# Patient Record
Sex: Female | Born: 1935 | Race: White | Hispanic: No | Smoking: Never smoker
Health system: Southern US, Community
[De-identification: ages and names within clinical notes are randomized; demographics above are authoritative.]

## PROBLEM LIST (undated history)

## (undated) DIAGNOSIS — I4891 Unspecified atrial fibrillation: Secondary | ICD-10-CM

## (undated) DIAGNOSIS — I82409 Acute embolism and thrombosis of unspecified deep veins of unspecified lower extremity: Secondary | ICD-10-CM

## (undated) HISTORY — PX: APPENDECTOMY: SHX54

## (undated) HISTORY — DX: Unspecified atrial fibrillation: I48.91

## (undated) HISTORY — DX: Acute embolism and thrombosis of unspecified deep veins of unspecified lower extremity: I82.409

## (undated) HISTORY — PX: CHOLECYSTECTOMY: SHX55

---

## 2001-02-28 ENCOUNTER — Encounter: Payer: Self-pay | Admitting: Emergency Medicine

## 2001-02-28 ENCOUNTER — Emergency Department (HOSPITAL_COMMUNITY): Admission: EM | Admit: 2001-02-28 | Discharge: 2001-02-28 | Payer: Self-pay | Admitting: Emergency Medicine

## 2001-03-01 ENCOUNTER — Ambulatory Visit (HOSPITAL_COMMUNITY): Admission: RE | Admit: 2001-03-01 | Discharge: 2001-03-01 | Payer: Self-pay | Admitting: Obstetrics & Gynecology

## 2002-07-13 ENCOUNTER — Ambulatory Visit (HOSPITAL_COMMUNITY): Admission: RE | Admit: 2002-07-13 | Discharge: 2002-07-13 | Payer: Self-pay | Admitting: Gastroenterology

## 2002-07-13 ENCOUNTER — Encounter (INDEPENDENT_AMBULATORY_CARE_PROVIDER_SITE_OTHER): Payer: Self-pay | Admitting: *Deleted

## 2003-03-12 ENCOUNTER — Ambulatory Visit (HOSPITAL_COMMUNITY): Admission: RE | Admit: 2003-03-12 | Discharge: 2003-03-12 | Payer: Self-pay | Admitting: Obstetrics & Gynecology

## 2003-09-26 ENCOUNTER — Inpatient Hospital Stay (HOSPITAL_COMMUNITY): Admission: EM | Admit: 2003-09-26 | Discharge: 2003-09-28 | Payer: Self-pay | Admitting: Emergency Medicine

## 2003-10-03 ENCOUNTER — Encounter: Admission: RE | Admit: 2003-10-03 | Discharge: 2003-10-03 | Payer: Self-pay | Admitting: Internal Medicine

## 2003-10-24 ENCOUNTER — Encounter: Admission: RE | Admit: 2003-10-24 | Discharge: 2003-10-24 | Payer: Self-pay | Admitting: Internal Medicine

## 2004-02-07 ENCOUNTER — Emergency Department (HOSPITAL_COMMUNITY): Admission: EM | Admit: 2004-02-07 | Discharge: 2004-02-08 | Payer: Self-pay | Admitting: Emergency Medicine

## 2004-03-19 ENCOUNTER — Ambulatory Visit (HOSPITAL_COMMUNITY): Admission: RE | Admit: 2004-03-19 | Discharge: 2004-03-19 | Payer: Self-pay | Admitting: Internal Medicine

## 2005-05-25 ENCOUNTER — Ambulatory Visit (HOSPITAL_COMMUNITY): Admission: RE | Admit: 2005-05-25 | Discharge: 2005-05-26 | Payer: Self-pay | Admitting: Specialist

## 2005-08-27 ENCOUNTER — Encounter: Admission: RE | Admit: 2005-08-27 | Discharge: 2005-08-27 | Payer: Self-pay | Admitting: Specialist

## 2010-04-11 ENCOUNTER — Ambulatory Visit: Payer: Self-pay | Admitting: Internal Medicine

## 2010-04-16 LAB — CBC WITH DIFFERENTIAL/PLATELET
BASO%: 0.3 % (ref 0.0–2.0)
Basophils Absolute: 0 10*3/uL (ref 0.0–0.1)
EOS%: 2.4 % (ref 0.0–7.0)
Eosinophils Absolute: 0.1 10*3/uL (ref 0.0–0.5)
HCT: 43.5 % (ref 34.8–46.6)
HGB: 14.9 g/dL (ref 11.6–15.9)
LYMPH%: 33.6 % (ref 14.0–49.7)
MCH: 32.5 pg (ref 25.1–34.0)
MCHC: 34.3 g/dL (ref 31.5–36.0)
MCV: 94.7 fL (ref 79.5–101.0)
MONO#: 0.6 10*3/uL (ref 0.1–0.9)
MONO%: 10.3 % (ref 0.0–14.0)
NEUT#: 3.1 10*3/uL (ref 1.5–6.5)
NEUT%: 53.4 % (ref 38.4–76.8)
Platelets: 220 10*3/uL (ref 145–400)
RBC: 4.6 10*6/uL (ref 3.70–5.45)
RDW: 12.5 % (ref 11.2–14.5)
WBC: 5.8 10*3/uL (ref 3.9–10.3)
lymph#: 1.9 10*3/uL (ref 0.9–3.3)

## 2010-04-16 LAB — COMPREHENSIVE METABOLIC PANEL
ALT: 30 U/L (ref 0–35)
AST: 28 U/L (ref 0–37)
Albumin: 3.9 g/dL (ref 3.5–5.2)
Alkaline Phosphatase: 101 U/L (ref 39–117)
BUN: 16 mg/dL (ref 6–23)
CO2: 22 mEq/L (ref 19–32)
Calcium: 8.8 mg/dL (ref 8.4–10.5)
Chloride: 107 mEq/L (ref 96–112)
Creatinine, Ser: 0.85 mg/dL (ref 0.40–1.20)
Glucose, Bld: 90 mg/dL (ref 70–99)
Potassium: 4.5 mEq/L (ref 3.5–5.3)
Sodium: 143 mEq/L (ref 135–145)
Total Bilirubin: 0.4 mg/dL (ref 0.3–1.2)
Total Protein: 7.2 g/dL (ref 6.0–8.3)

## 2010-04-16 LAB — LACTATE DEHYDROGENASE: LDH: 194 U/L (ref 94–250)

## 2010-04-16 LAB — IRON AND TIBC
%SAT: 39 % (ref 20–55)
Iron: 102 ug/dL (ref 42–145)
TIBC: 260 ug/dL (ref 250–470)
UIBC: 158 ug/dL

## 2010-04-16 LAB — FERRITIN: Ferritin: 206 ng/mL (ref 10–291)

## 2010-04-16 LAB — VITAMIN B12: Vitamin B-12: 918 pg/mL — ABNORMAL HIGH (ref 211–911)

## 2010-04-16 LAB — FOLATE: Folate: >20 ng/mL

## 2011-02-13 NOTE — Op Note (Signed)
NAMESAMANI, DEAL NO.:  [id number]   MEDICAL RECORD NO.:  [id number]          PATIENT TYPE:  AMB   LOCATION:  DAY                          FACILITY:  [facility]   PHYSICIAN:  Jene Every, M.D.    DATE OF BIRTH:  [birth year]   DATE OF PROCEDURE:  05/25/2005  DATE OF DISCHARGE:                                 OPERATIVE REPORT   PREOPERATIVE DIAGNOSIS:  Rotator cuff and impingement syndrome of the right  shoulder.   PROCEDURE PERFORMED:  1.  Examination under anesthesia.  2.  Right shoulder open acromioplasty with subacromial decompression, repair      of rotator cuff, utilizing two Mitek suture anchors.   ANESTHESIA:  General.   ASSISTANT:  Roma Schanz, P.A.   BRIEF HISTORY/INDICATIONS:  A 77 year old female with refractory shoulder  pain.  MRI indicating rotator cuff tear.  Operative intervention was  indicated for repair of the torn rotator cuff.  Risks and benefits were  discussed, including bleeding, infection, damage to vascular structures,  suboptimal range of motion, retear of knee, need for revision, etc.   TECHNIQUE:  With the patient supine in a beach-chair position after the  induction of general anesthesia and antibiotics, the right shoulder and  upper extremity was prepped and draped in the usual sterile fashion.  Just  prior to that, we ranged the shoulder.  She had full range of motion of the  shoulder.  An incision was made in the anterior aspect of the shoulder and  __________ line.  Subcutaneous tissue was dissected .  Electrocautery  utilized to achieved hemostasis.  Raphe between the anterior and lateral  heads was identified and developed.  Subperiosteal elevator from the  anterolateral aspect of the acromion and anteromedial aspect of the  acromion.  The ACL ligament was detached and excised.  The anterior spur was  removed with an oscillating saw and a Behr rongeur to a type 1 acromion.  Hypertrophic bursa were then excised.  A  full thickness retracted tear, 1 x  1 cm, was noted in the supraspinatus.  We diverted the edges.  Performed a  bony trough with a Beyer rongeur.  Advanced the tendon and digitally  mobilized that.  Broke up some subacromial adhesions.  I then advanced and  delivered it into the trough and anchored it with two Mitek suture anchors  with excellent purchase.  Good range without tension on the repair site.  Full coverage, water-tight seal was noted.  The wound was copiously  irrigated with antibiotic irrigation.  I repaired the raphe with #1 Vicryl  interrupted figure-of-eight sutures over to and through the acromion.  Subcutaneous tissue reapproximated with 2-0 Vicryl sutures.  The skin is  reapproximated with 4-0 subcuticular  Prolene.  We reinforced Steri-Strips.  Sterile dressing applied.  Placed in  an abduction pillow.  Extubated without difficulty and transported to the  recovery room in satisfactory condition.  Patient tolerated the procedure  well with no complications.  Minimal blood loss.      Jene Every, M.D.  Electronically Signed     JB/MEDQ  D:  05/25/2005  T:  05/25/2005  Job:  [id number]

## 2011-02-13 NOTE — Discharge Summary (Signed)
NAME:  EUDELL, JULIAN                          ACCOUNT NO.:  [id number]   MEDICAL RECORD NO.:  [id number]                   PATIENT TYPE:  INP   LOCATION:  3728                                 FACILITY:  [facility]   PHYSICIAN:  C. Ulyess Mort, M.D.             DATE OF BIRTH:  1933/03/15   DATE OF ADMISSION:  09/26/2003  DATE OF DISCHARGE:  09/28/2003                                 DISCHARGE SUMMARY   DISCHARGE DIAGNOSES:  1. Chest pain, atypical.  Cardiolite on August 30, 2003, with normal     ejection fraction and normal perfusion.  Cardiac enzymes negative x 3.  D-     dimer negative.  Carotid Dopplers negative.  2. Hyperlipidemia.  3. Hypokalemia.  4. Glaucoma.  5. Status post cholecystectomy.  6. Status post polypectomy in October of 2003.   DISCHARGE MEDICATIONS:  1. Aspirin 81 mg p.o. daily.  2. Travatan eyedrops one drop q.h.s.  3. Protonix 40 mg p.o. daily.  4. Toprol XL 12.5 mg p.o. daily.   HOSPITAL FOLLOWUP:  The patient will follow up with Dr. Jens Som of [facility]  [facility]iology in two to four weeks.  The patient will follow up in the [facility] with Dr. Aundria Rud.   CONSULTS:  Olga Millers, M.D., with [facility] on September 26, 2003.   PROCEDURES:  1. On September 26, 2003, chest x-ray.  Impression:  No acute abnormality.  2. On September 27, 2003, Cardiolite.  Impression:  No evidence of     pharmacologic-induced myocardial ischemia, normal left ventricular wall     motion study, calculated ejection fraction of 66%.  3. On September 27, 2003, carotid Dopplers.  Impression:  Bilaterally no ICA     stenosis, vertebral artery with flow antegrade.   HISTORY OF PRESENT ILLNESS:  A 77 year old white female who presented to the  emergency department with chest pain.  At approximately 12:30 a.m. the  morning of admission, the patient awoke with substernal chest pain.  It  began in the epigastric region and radiated to the neck, shoulders,  back,  and both arms, left arm greater than right.  She described the pain as  cramping in sensation.  It was associated with nausea and shortness of  breath.  However, there was no vomiting or diaphoresis.  The pain lasted for  approximately 12 hours.  She rated the pain as a 10/10 with a decline to  6/10 in the emergency room on nitroglycerin and aspirin.  The patient also  reports feeling the discomfort to both arms over the last week.   ALLERGIES:  PENICILLIN.   SOCIAL HISTORY:  Occasional smoking.  No alcohol, cocaine, or IV fluids.  Widowed in 1990.  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  The patient has one son who  was called in an accident in 2000.  The patient recently lost the couple for  whom she worked within the last month.   FAMILY MEDICAL HISTORY:  Mother deceased at 7 with a history of strokes.  Father deceased at 75 secondary to accident.  The patient has three sisters,  one with DDD, and two brothers, one with arthritis.  The patient has one  daughter who is healthy.   REVIEW OF SYSTEMS:  Fatigue, chest pain, palpitations, dyspnea, nausea,  weakness in hand grip, depression, anxiety, and imbalance.   PHYSICAL EXAMINATION:  VITAL SIGNS:  Temperature 98.2 degrees, blood  pressure 156/69, pulse 60, respirations 20, saturating 94% on room air.  GENERAL APPEARANCE:  Resting comfortably in no apparent distress.  HEENT:  Extraocular movements intact.  Pupils equal, round, and reactive to  light.  Oropharynx clear.  NECK:  No lymphadenopathy.  No JVD.  LUNGS:  Clear to auscultation bilaterally.  HEART:  Regular rate and rhythm.  No murmurs, rubs, or gallops.  ABDOMEN:  Soft and nondistended.  Positive bowel sounds.  Mildly tender to  palpation with guarding of right lower quadrant.  EXTREMITIES:  No cyanosis, clubbing, or edema.  Good pulses.  SKIN:  Without rash.  NEUROLOGIC:  No focal deficits.  Good strength  throughout.  PSYCHIATRIC:  Depressed.   LABORATORY DATA:  EKG with normal sinus rhythm with no ST changes,  depression, or elevation.  Chest x-ray with no active disease.  Sodium 142,  potassium 4.2, chloride 107, bicarbonate 27, BUN 15, creatinine 0.7, glucose  100.  WBC 4.8, hemoglobin 15.6, platelets 227.   HOSPITAL COURSE:  #1 - CHEST PAIN:  The differential diagnosis includes  acute MI, acute coronary artery syndrome, pulmonary embolism, aortic  dissection, GERD, and panic disorder.  The patient was placed heparin and a  nitroglycerin drip.  Cardiac enzymes were negative x 3.  EKG showed no ST  elevation or depression or T wave changes.  The patient's blood pressure was  checked in both arms and the patient was ruled out for aortic dissection by  this.  Chest x-ray showed no mediastinal widening.  D-dimer within normal  limits.  The patient was sent for Cardiolite which showed no ischemia and a  normal ejection fraction.  The patient was placed on aspirin.  With negative  cardiac enzymes and Cardiolite, heparin and nitroglycerin were discontinued.  The patient will be continued on baby aspirin and Toprol 12.5 mg daily as an  outpatient.  The patient will have followup with Olga Millers, M.D., in  two to four weeks.  For frank chest pain, the patient may need cardiac  catheterization.  This will be reviewed with Dr. Jens Som in his office.   #2 - VENTRICULAR TACHYCARDIA:  The patient had two runs of nonsustained  ventricular tachycardia.  She was asymptomatic during these episodes.  No  syncope.  No shortness of breath.  The patient was placed on Toprol 12.5 mg  daily.  The patient's potassium was repleted as needed.  Dr. Jens Som will  consider outpatient Holter monitor on Toprol.  This will be discussed at her  followup outpatient visit.   #3 - HYPOKALEMIA:  This was repleted and the discharge potassium is 4.2.  #4 - HYPERLIPIDEMIA:  The patient's total cholesterol and LDL  cholesterol  are elevated.  The patient has no history of cardiac disease, no family  history of cardiac disease, no history of smoking, and no history of  diabetes.  The patient may require treatment with statins in the future.  However, the first option would be control with diet.  It can be further  adjusted as an outpatient.   #5 - DEPRESSION/ANXIETY:  The patient has had multiple stressors in the last  several years.  The patient could possibly benefit from grief counseling or  the addition of an antidepressant.  This too can be addressed at her  followup outpatient visit.   DISCHARGE LABORATORIES:  WBC 4.6, hemoglobin 14.4, hematocrit 43, platelets  219.  Sodium 141, potassium 4.2, chloride 109, bicarbonate 28, BUN 14,  creatinine 0.9, glucose 65, calcium 8.9, magnesium 2.3.  TSH 2.270.  Total  cholesterol 223, triglycerides 94, HDL 66, LDL 138.  Hemoglobin A1C 5.4.  D-  dimer 0.39.  CKs 187, 168, and 173, CK-MBs 1.5, 1.0, and 1.2, and troponins  0.01, less than 0.01, and 0.01.      Delbert Harness, MD                    Gary Fleet, M.D.    Cleotis Lema  D:  09/28/2003  T:  09/28/2003  Job:  [id number]

## 2011-02-13 NOTE — Consult Note (Signed)
NAME:  Angela Leach, Angela Leach                          ACCOUNT NO.:  [id number]   MEDICAL RECORD NO.:  [id number]                   PATIENT TYPE:  INP   LOCATION:  3728                                 FACILITY:  [facility]   PHYSICIAN:  Olga Millers, M.D.                DATE OF BIRTH:  [birth year]   DATE OF CONSULTATION:  09/26/2003  DATE OF DISCHARGE:                                   CONSULTATION   REASON FOR CONSULTATION:  Angela Leach is a pleasant 77 year old female with  no prior cardiac history who we are asked to evaluate for chest pain.   HISTORY OF PRESENT ILLNESS:  The patient typically did not have exertional  chest pain, dyspnea on exertion, orthopnea, or history of syncope.  She does  have chronic mild pedal edema.  At approximately 12:30 this morning, she  awoke with substernal chest pain. It began in the epigastric area and  radiated to her neck, shoulders, and back.  She described the pain as a  severe hurt.  There was mild nausea and shortness of breath but there was  no diaphoresis.  The pain was not pleuritic in position, nausea, or related  to food.  It lasted for approximately 12 hours and improved, although did  not completely resolve with nitroglycerin and aspirin in the emergency room.   Because of these symptoms, we were asked to further evaluate.   PAST MEDICAL HISTORY:  1. There is no diabetes mellitus, hypertension, or hyperlipidemia.  2. She has had a prior cholecystectomy.  3. She has had a prior fractured ankle.  4. She has had a previous polypectomy.  5. Glaucoma.   SOCIAL HISTORY:  She does not smoke nor does she consume alcohol.   FAMILY HISTORY:  Negative for coronary artery disease.   REVIEW OF SYMPTOMS:  She is under a significant amount of stress recently.  She has family issues as well as financial issues by her and her daughter's  report.  She denies any headaches.  No fevers or chills.  There is no  productive cough or hemoptysis.  There is no  odynophagia, dysphagia, melena,  or hematochezia.  There is no dysuria or hematuria.  There is no rash or  seizure activity. There is no orthopnea or PND but there is mild chronic  pedal edema.  There is no claudication noted. She does have some pain in her  feet when she ambulates related to bone spur.  The remaining systems are  negative.   PHYSICAL EXAMINATION:  VITAL SIGNS:  She has a blood pressure 97/67, pulse  is 81.  She is afebrile.  GENERAL:  She is well-developed, well-nourished and in no acute distress.  She is somewhat depressed at the time of the evaluation.  SKIN:  Warm and dry.  EXTREMITIES:  There is no peripheral clubbing.  HEENT:  Unremarkable with normal eye lids.  NECK:  Supple with  normal upstrokes bilaterally and no bruits noted.  There  is no jugular venous distention and no thyromegaly noted.  CHEST:  Clear to auscultation.  Normal expansion.  CARDIOVASCULAR:  Regular rate and rhythm.  Normal S1 and S2.  There are no  murmurs, rubs, or gallops noted.  ABDOMEN:  Nontender.  No hepatosplenomegaly and no masses appreciated.  She  has a prior scar from a prior cholecystectomy.  EXTREMITIES:  She has 2+ femoral pulses bilaterally and no bruits.  Her  extremities show no edema, although there are mild varicosities.  I cannot  palpate cords.  She has 2+ dorsalis pedis pulses bilaterally.  NEUROLOGICAL:  Grossly intact.   LABORATORY DATA:  Electrocardiogram shows normal sinus rhythm with  occasional PVCs and no ST changes.  Initial enzymes are negative.  Her d-  dimer is normal at 0.39.  Her chest x-ray shows no acute disease.   DIAGNOSIS:  Atypical chest pain.   PLAN:  Angela Leach presents with chest pain of uncertain etiology. It is  somewhat atypical as it is prolonged in duration at approximately 12 hours  with a negative electrocardiogram and her initial enzymes are also negative.  We will continue to cycle enzymes to rule out myocardial infarction.  If   there are negative, we will plan to risk stratify with an Adenosine-  Cardiolite tomorrow morning.  If her d-dimer is negative, therefore, I think  we have excluded pulmonary embolus.  She is under a significant amount of  stress and there is also some tenderness on exam.  I wonder if this may be  musculoskeletal.  She also has a significant amount of belching and I agree  with Protonix.  She has recently complained of some numbness in her left  upper extremity and we will check carotid dopplers to exclude carotid  disease.                                               Olga Millers, M.D.    BC/MEDQ  D:  09/26/2003  T:  09/26/2003  Job:  [id number]

## 2011-04-27 ENCOUNTER — Other Ambulatory Visit: Payer: Self-pay | Admitting: Gastroenterology

## 2014-04-14 ENCOUNTER — Emergency Department (HOSPITAL_COMMUNITY)
Admission: EM | Admit: 2014-04-14 | Discharge: 2014-04-14 | Disposition: A | Discharge status: Home / Self Care | Payer: Medicare PPO | Attending: Emergency Medicine | Admitting: Emergency Medicine

## 2014-04-14 ENCOUNTER — Emergency Department (HOSPITAL_COMMUNITY): Payer: Medicare PPO

## 2014-04-14 ENCOUNTER — Encounter (HOSPITAL_COMMUNITY): Payer: Self-pay | Admitting: Emergency Medicine

## 2014-04-14 DIAGNOSIS — Y9389 Activity, other specified: Secondary | ICD-10-CM | POA: Insufficient documentation

## 2014-04-14 DIAGNOSIS — W298XXA Contact with other powered powered hand tools and household machinery, initial encounter: Secondary | ICD-10-CM | POA: Insufficient documentation

## 2014-04-14 DIAGNOSIS — S61219A Laceration without foreign body of unspecified finger without damage to nail, initial encounter: Secondary | ICD-10-CM

## 2014-04-14 DIAGNOSIS — Y9289 Other specified places as the place of occurrence of the external cause: Secondary | ICD-10-CM | POA: Insufficient documentation

## 2014-04-14 DIAGNOSIS — S61209A Unspecified open wound of unspecified finger without damage to nail, initial encounter: Secondary | ICD-10-CM | POA: Insufficient documentation

## 2014-04-14 NOTE — ED Provider Notes (Signed)
CSN: [id number]     Arrival date & time 04/14/14  1305 History  This chart was scribed for non-physician practitioner, Rhea Bleacher, PA-C working with Shanna Cisco, MD, by Jarvis Morgan, ED Scribe. This patient was seen in room TR07C/TR07C and the patient's care was started at 1:08 PM.      Chief Complaint  Patient presents with  . Extremity Laceration    The history is provided by the patient. No language interpreter was used.   HPI Comments: Angela Leach is a 80 y.o. female who presents to the Emergency Department due to laceration to her ring and middle finger on her left hand that occurred 1 day ago. Patient states she was trying to take a power saw blade off while doing yard work. The laceration was actually caused by the [facility] wrench she was using to take the saw blade off. She states she contained the bleeding yesterday with Band-Aid's. Patient is not currently on any blood thinner medication. There is no active bleeding at this time. She states that she is unable to move those two fingers. Patient states the saw blade was clean and she washed the cut area immediately after the laceration occurred. She states she also put hydrogen peroxide on the area. She states that her vacinnations are UTD. She denies any numbness or rash to the area.    History reviewed. No pertinent past medical history. History reviewed. No pertinent past surgical history. History reviewed. No pertinent family history. History  Substance Use Topics  . Smoking status: Never Smoker   . Smokeless tobacco: Not on file  . Alcohol Use: Not on file   OB History   Grav Para Term Preterm Abortions TAB SAB Ect Mult Living                 Review of Systems  Constitutional: Negative for activity change.  Musculoskeletal: Positive for arthralgias (ring and middle finger on left hand). Negative for back pain, joint swelling and neck pain.  Skin: Positive for wound (lac to the left ring and middle finger. no active  bleeding). Negative for rash.  Neurological: Negative for weakness and numbness.    Allergies  Review of patient's allergies indicates not on file.  Home Medications   Prior to Admission medications   Not on File   Triage Vitals: BP 126/72  Pulse 74  Temp(Src) 98.3 F (36.8 C) (Oral)  Resp 20  SpO2 97%  Physical Exam  Nursing note and vitals reviewed. Constitutional: She appears well-developed and well-nourished. No distress.  HENT:  Head: Normocephalic and atraumatic.  Eyes: Conjunctivae and EOM are normal. Pupils are equal, round, and reactive to light.  Neck: Normal range of motion. Neck supple. No tracheal deviation present.  Cardiovascular: Normal rate.  Exam reveals no decreased pulses.   Pulmonary/Chest: Effort normal. No respiratory distress.  Musculoskeletal: Normal range of motion. She exhibits tenderness. She exhibits no edema.  Neurological: She is alert. No sensory deficit.  Motor, sensation, and vascular distal to the injury is fully intact.   Skin: Skin is warm and dry. Laceration noted.  Normal cap refill. 1cm laceration to the dorsum of 3rd and 4th digits between MCP and PIP. Normal extension and flexion with some pain. Skin is healed and wound is approximated. No drainage. Normal cap refill.   Psychiatric: She has a normal mood and affect. Her behavior is normal.    ED Course  Procedures (including critical care time)  DIAGNOSTIC STUDIES: Oxygen Saturation is  97% on RA, normal by my interpretation.    COORDINATION OF CARE: 1:15 PM- Will order diagnostic imaging of left hand. Pt advised of plan for treatment and pt agrees.   Labs Review Labs Reviewed - No data to display  Imaging Review Dg Hand Complete Left  04/14/2014   CLINICAL DATA:  Lacerations between the third and fourth digits.  EXAM: LEFT HAND - COMPLETE 3+ VIEW  COMPARISON:  None.  FINDINGS: Distal tuft of the fourth finger has a lobulated appearance without a convincing fracture line.  This may be from remote trauma.  No acute fracture. No dislocation. Bones are diffusely demineralized.  No radiopaque foreign body.  IMPRESSION: No acute fracture or dislocation.  No radiopaque foreign body.   Electronically Signed   By: Amie Portlandavid  Ormond M.D.   On: 04/14/2014 13:56     EKG Interpretation None      BP 126/72  Pulse 74  Temp(Src) 98.3 F (36.8 C) (Oral)  Resp 20  SpO2 97%  Pt informed of x-ray results. Patient counseled on wound care. Patient was urged to return to the Emergency Department urgently with worsening pain, swelling, expanding erythema especially if it streaks away from the affected area, fever, or if they have any other concerns. Patient verbalized understanding.     MDM   Final diagnoses:  Finger laceration, initial encounter   Patient with finger laceration. Normal extension and sensation. Tetanus up-to-date. Fingers are neurovascularly intact. X-ray neg. No immunocompromise. Wound is not dirty. Do not feel abx prophylaxis indicated.   I personally performed the services described in this documentation, which was scribed in my presence. The recorded information has been reviewed and is accurate.     Renne CriglerJoshua Adalaide Jaskolski, PA-C 04/14/14 1413

## 2014-04-14 NOTE — ED Notes (Signed)
Pt was using a power saw to do tile work in her bathroom. She lacerated 3rd and 4th fingers. This happened 24 hours ago. Bleeding is controlled edges approximate well.

## 2014-04-14 NOTE — ED Notes (Signed)
Patient transported to X-ray 

## 2014-04-14 NOTE — ED Notes (Signed)
Lacerations to left ring and middle fingers cleaned with a combination of normal saline and hydrogen peroxide; applied bacitracin to both lacerations, applied sterile 2x2s and wrapped with guaze on both fingers

## 2014-04-14 NOTE — ED Provider Notes (Signed)
Medical screening examination/treatment/procedure(s) were performed by non-physician practitioner and as supervising physician I was immediately available for consultation/collaboration.  Giulianna Rocha E Onesha Krebbs, MD 04/14/14 2042 

## 2014-04-14 NOTE — Discharge Instructions (Signed)
Please read and follow all provided instructions.  Your diagnoses today include:  1. Finger laceration, initial encounter    Tests performed today include:  X-ray of the affected area that did not show any foreign bodies or broken bones  Vital signs. See below for your results today.   Medications prescribed:   None  Take any prescribed medications only as directed.   Home care instructions:  Follow any educational materials and wound care instructions contained in this packet.   Keep affected area above the level of your heart when possible to minimize swelling. Wash area gently twice a day with warm soapy water. Do not apply alcohol or hydrogen peroxide. Cover the area if it draining or weeping.   Follow-up instructions:  Return instructions:  Return to the Emergency Department if you have:  Fever  Worsening pain  Worsening swelling of the wound  Pus draining from the wound  Redness of the skin that moves away from the wound, especially if it streaks away from the affected area   Any other emergent concerns  Your vital signs today were: BP 126/72   Pulse 74   Temp(Src) 98.3 F (36.8 C) (Oral)   Resp 20   SpO2 97% If your blood pressure (BP) was elevated above 135/85 this visit, please have this repeated by your doctor within one month. --------------

## 2015-02-04 ENCOUNTER — Encounter (HOSPITAL_COMMUNITY): Payer: Self-pay | Admitting: Emergency Medicine

## 2015-02-04 ENCOUNTER — Emergency Department (HOSPITAL_COMMUNITY): Payer: PPO

## 2015-02-04 ENCOUNTER — Observation Stay (HOSPITAL_COMMUNITY)
Admission: EM | Admit: 2015-02-04 | Discharge: 2015-02-05 | Disposition: A | Discharge status: Home / Self Care | Payer: PPO | Attending: Internal Medicine | Admitting: Internal Medicine

## 2015-02-04 DIAGNOSIS — S42201A Unspecified fracture of upper end of right humerus, initial encounter for closed fracture: Principal | ICD-10-CM | POA: Insufficient documentation

## 2015-02-04 DIAGNOSIS — Y92009 Unspecified place in unspecified non-institutional (private) residence as the place of occurrence of the external cause: Secondary | ICD-10-CM | POA: Insufficient documentation

## 2015-02-04 DIAGNOSIS — Y9301 Activity, walking, marching and hiking: Secondary | ICD-10-CM | POA: Insufficient documentation

## 2015-02-04 DIAGNOSIS — W19XXXA Unspecified fall, initial encounter: Secondary | ICD-10-CM | POA: Diagnosis not present

## 2015-02-04 DIAGNOSIS — S42301A Unspecified fracture of shaft of humerus, right arm, initial encounter for closed fracture: Secondary | ICD-10-CM | POA: Diagnosis present

## 2015-02-04 DIAGNOSIS — Z880 Allergy status to penicillin: Secondary | ICD-10-CM | POA: Diagnosis not present

## 2015-02-04 DIAGNOSIS — R42 Dizziness and giddiness: Secondary | ICD-10-CM | POA: Diagnosis not present

## 2015-02-04 LAB — I-STAT CHEM 8, ED
BUN: 24 mg/dL — ABNORMAL HIGH (ref 6–20)
Calcium, Ion: 1.12 mmol/L — ABNORMAL LOW (ref 1.13–1.30)
Chloride: 106 mmol/L (ref 101–111)
Creatinine, Ser: 0.9 mg/dL (ref 0.44–1.00)
Glucose, Bld: 119 mg/dL — ABNORMAL HIGH (ref 70–99)
HCT: 45 % (ref 36.0–46.0)
Hemoglobin: 15.3 g/dL — ABNORMAL HIGH (ref 12.0–15.0)
Potassium: 3.8 mmol/L (ref 3.5–5.1)
Sodium: 142 mmol/L (ref 135–145)
TCO2: 22 mmol/L (ref 0–100)

## 2015-02-04 LAB — I-STAT TROPONIN, ED: TROPONIN I, POC: 0 ng/mL (ref 0.00–0.08)

## 2015-02-04 MED ORDER — HEPARIN SODIUM (PORCINE) 5000 UNIT/ML IJ SOLN
5000.0000 [IU] | Freq: Three times a day (TID) | INTRAMUSCULAR | Status: DC
  Administered 2015-02-05 (×3): 5000 [IU] via SUBCUTANEOUS
  Filled 2015-02-04 (×3): qty 1

## 2015-02-04 MED ORDER — SENNOSIDES-DOCUSATE SODIUM 8.6-50 MG PO TABS: 1.0000 | ORAL_TABLET | Freq: Every evening | ORAL | Status: DC | PRN

## 2015-02-04 MED ORDER — HYDROCODONE-ACETAMINOPHEN 5-325 MG PO TABS
1.0000 | ORAL_TABLET | ORAL | Status: DC | PRN
  Administered 2015-02-05 (×4): 2 via ORAL
  Filled 2015-02-04 (×4): qty 2

## 2015-02-04 MED ORDER — ONDANSETRON HCL 4 MG PO TABS: 4.0000 mg | ORAL_TABLET | Freq: Four times a day (QID) | ORAL | Status: DC | PRN

## 2015-02-04 MED ORDER — ACETAMINOPHEN 650 MG RE SUPP: 650.0000 mg | Freq: Four times a day (QID) | RECTAL | Status: DC | PRN

## 2015-02-04 MED ORDER — ACETAMINOPHEN 325 MG PO TABS: 650.0000 mg | ORAL_TABLET | Freq: Four times a day (QID) | ORAL | Status: DC | PRN

## 2015-02-04 MED ORDER — HYDROCODONE-ACETAMINOPHEN 5-325 MG PO TABS
2.0000 | ORAL_TABLET | Freq: Once | ORAL | Status: AC
  Administered 2015-02-04: 2 via ORAL
  Filled 2015-02-04: qty 2

## 2015-02-04 MED ORDER — ONDANSETRON HCL 4 MG/2ML IJ SOLN: 4.0000 mg | Freq: Four times a day (QID) | INTRAMUSCULAR | Status: DC | PRN

## 2015-02-04 MED ORDER — OXYCODONE-ACETAMINOPHEN 5-325 MG PO TABS
2.0000 | ORAL_TABLET | Freq: Once | ORAL | Status: AC
  Administered 2015-02-04: 2 via ORAL
  Filled 2015-02-04: qty 2

## 2015-02-04 MED ORDER — SODIUM CHLORIDE 0.9 % IJ SOLN
3.0000 mL | Freq: Two times a day (BID) | INTRAMUSCULAR | Status: DC
  Administered 2015-02-05 (×2): 3 mL via INTRAVENOUS

## 2015-02-04 MED ORDER — SODIUM CHLORIDE 0.9 % IV SOLN: INTRAVENOUS | Status: DC

## 2015-02-04 NOTE — ED Notes (Signed)
Phlebotomy enroute to draw labs. 

## 2015-02-04 NOTE — ED Notes (Signed)
Pt ambulated well to door in room. Pt felt dizzy walking back to bed.

## 2015-02-04 NOTE — ED Notes (Signed)
Attempted report 

## 2015-02-04 NOTE — ED Notes (Signed)
EMT and NT at bedside for ambulating patient.

## 2015-02-04 NOTE — Progress Notes (Signed)
Orthopedic Tech Progress Note Patient Details:  Rodney Boozerlis M Wigger Feb 20, 1933 [id number]  Ortho Devices Type of Ortho Device: Arm sling Ortho Device/Splint Location: RUE Ortho Device/Splint Interventions: Ordered, Application   Jennye MoccasinHughes, Ilo Beamon Craig 02/04/2015, 9:03 PM

## 2015-02-04 NOTE — H&P (Signed)
[facility] History and Physical  Patient: Angela Leach  MRN: [id number]  DOB: 07/15/1933  DOS: the patient was seen and examined on 02/04/2015 PCP: No primary care provider on file.  Referring physician: Glynn Octave, MD Chief Complaint: Dizziness, mechanical fall  HPI: Angela Leach is a 81 y.o. female with no significant Past medical history. The patient is presenting to the hospital after a mechanical fall. Patient mentions that she was walking in her kitchen and her left foot tripped over and moving and she fell kitchen floor hitting the table on the right forehead. She passed out and found herself underneath the table. Her daughter helped her to the car and brought her to the hospital. She was complaining of severe pain on her right shoulder left forearm and left knee. She does not take any medications at home and has not seen any physician in the last 1 year. Her last physical was unremarkable. She does not remember the physician that she has seen. At the time of my evaluation she denies any blurring of the vision, nausea, vomiting, chest pain, shortness of breath, cough, diarrhea, constipation, burning urination, focal deficit. Initial plan was to have her follow-up as an outpatient with orthopedics. Later on while ambulating the patient complains of dizziness and lightheadedness and wasn't able to maintain balance on her own and as she is living alone she was uncomfortable going home and therefore was recommended to be admitted to the hospital.  The patient is coming from home. And at her baseline independent for most of her ADL.  Review of Systems: as mentioned in the history of present illness.  A comprehensive review of the other systems is negative.  History reviewed. No pertinent past medical history. History reviewed. No pertinent past surgical history. Social History:  reports that she has never smoked. She does not have any smokeless tobacco history on file. Her  alcohol and drug histories are not on file.  Allergies  Allergen Reactions  . Penicillins     unknown    History reviewed. No pertinent family history.  Prior to Admission medications   Not on File    Physical Exam: Filed Vitals:   02/04/15 2145 02/04/15 2200 02/04/15 2215 02/04/15 2230  BP: 137/62 145/69 130/60 128/61  Pulse: 81 76 78 79  Temp:      TempSrc:      Resp: SpO2: 91% 94% 91% 91%    General: Alert, Awake and Oriented to Time, Place and Person. Appear in mild distress Eyes: PERRL ENT: Oral Mucosa clear moist. Neck: no JVD Cardiovascular: S1 and S2 Present, no Murmur, Peripheral Pulses Present Respiratory: Bilateral Air entry equal and Decreased,  Clear to Auscultation, no Crackles, no wheezes Abdomen: Bowel Sound present, Soft and nono tender Skin: no Rash Extremities: no Pedal edema, no calf tenderness Right upper extremity wrapped Neurologic: Grossly no focal neuro deficit.  Labs on Admission:  CBC:  Recent Labs Lab 02/04/15 2030  HGB 15.3*  HCT 45.0    CMP     Component Value Date/Time   NA 142 02/04/2015 2030   K 3.8 02/04/2015 2030   CL 106 02/04/2015 2030   CO2 22 04/16/2010 1338   GLUCOSE 119* 02/04/2015 2030   BUN 24* 02/04/2015 2030   CREATININE 0.90 02/04/2015 2030   CALCIUM 8.8 04/16/2010 1338   PROT 7.2 04/16/2010 1338   ALBUMIN 3.9 04/16/2010 1338   AST 28 04/16/2010 1338   ALT 30 04/16/2010 1338  ALKPHOS 101 04/16/2010 1338   BILITOT 0.4 04/16/2010 1338    No results for input(s): LIPASE, AMYLASE in the last 168 hours.  No results for input(s): CKTOTAL, CKMB, CKMBINDEX, TROPONINI in the last 168 hours. BNP (last 3 results) No results for input(s): BNP in the last 8760 hours.  ProBNP (last 3 results) No results for input(s): PROBNP in the last 8760 hours.   Radiological Exams on Admission: Dg Chest 2 View  02/04/2015   CLINICAL DATA:  Right shoulder pain after falling today  EXAM: CHEST  2 VIEW   COMPARISON:  05/21/2005  FINDINGS: The heart size and mediastinal contours are within normal limits. Both lungs are clear. The visualized skeletal structures are unremarkable. There is a proximal right humeral fracture.  IMPRESSION: No active cardiopulmonary disease.  Proximal right humeral fracture.   Electronically Signed   By: Ellery Plunkaniel R Mitchell M.D.   On: 02/04/2015 19:08   Dg Shoulder Right  02/04/2015   CLINICAL DATA:  Right shoulder pain secondary to a fall today.  EXAM: RIGHT SHOULDER - 2+ VIEW  COMPARISON:  Humerus radiographs dated 02/04/2015  FINDINGS: There is a slightly impacted fracture of the right humeral neck with what appears to be avulsion of a portion of the greater tuberosity. No dislocation.  Surgical anchors from previous rotator cuff surgery.  IMPRESSION: Comminuted fracture of the proximal humerus.   Electronically Signed   By: Francene BoyersJames  Maxwell M.D.   On: 02/04/2015 19:08   Dg Elbow 2 Views Left  02/04/2015   CLINICAL DATA:  Fall, left elbow pain  EXAM: LEFT ELBOW - 2 VIEW  COMPARISON:  Forearm radiographs same date  FINDINGS: There is no evidence of fracture, dislocation, or joint effusion. There is no evidence of arthropathy or other focal bone abnormality. Soft tissues are unremarkable.  IMPRESSION: Negative.   Electronically Signed   By: Christiana PellantGretchen  Green M.D.   On: 02/04/2015 19:08   Dg Forearm Left  02/04/2015   CLINICAL DATA:  Pain after falling today  EXAM: LEFT FOREARM - 2 VIEW  COMPARISON:  None.  FINDINGS: There is no evidence of fracture or other focal bone lesions. Soft tissues are unremarkable.  IMPRESSION: Negative.   Electronically Signed   By: Ellery Plunkaniel R Mitchell M.D.   On: 02/04/2015 19:07   Ct Head Wo Contrast  02/04/2015   CLINICAL DATA:  The patient fell and struck the right side of his head. Headache. Neck pain extending into the right shoulder.  EXAM: CT HEAD WITHOUT CONTRAST  CT CERVICAL SPINE WITHOUT CONTRAST  TECHNIQUE: Multidetector CT imaging of the head and  cervical spine was performed following the standard protocol without intravenous contrast. Multiplanar CT image reconstructions of the cervical spine were also generated.  COMPARISON:  None.  FINDINGS: CT HEAD FINDINGS  No mass lesion. No midline shift. No acute hemorrhage or hematoma. No extra-axial fluid collections. No evidence of acute infarction. Brain parenchyma is normal. No osseous abnormality.  CT CERVICAL SPINE FINDINGS  There is no fracture or acute subluxation. There is multilevel degenerative disc and joint disease throughout the cervical spine. No prevertebral soft tissue swelling. There is fairly severe right foraminal stenosis at C3-4 and C4-5 and severe bilateral foraminal stenosis C5-6 and C6-7.  IMPRESSION: 1. No acute intracranial abnormality. 2. No acute abnormality of the cervical spine. Multilevel degenerative disc and joint disease with severe foraminal stenoses at multiple levels.   Electronically Signed   By: Francene BoyersJames  Maxwell M.D.   On: 02/04/2015 21:14  Ct Cervical Spine Wo Contrast  02/04/2015   CLINICAL DATA:  The patient fell and struck the right side of his head. Headache. Neck pain extending into the right shoulder.  EXAM: CT HEAD WITHOUT CONTRAST  CT CERVICAL SPINE WITHOUT CONTRAST  TECHNIQUE: Multidetector CT imaging of the head and cervical spine was performed following the standard protocol without intravenous contrast. Multiplanar CT image reconstructions of the cervical spine were also generated.  COMPARISON:  None.  FINDINGS: CT HEAD FINDINGS  No mass lesion. No midline shift. No acute hemorrhage or hematoma. No extra-axial fluid collections. No evidence of acute infarction. Brain parenchyma is normal. No osseous abnormality.  CT CERVICAL SPINE FINDINGS  There is no fracture or acute subluxation. There is multilevel degenerative disc and joint disease throughout the cervical spine. No prevertebral soft tissue swelling. There is fairly severe right foraminal stenosis at C3-4  and C4-5 and severe bilateral foraminal stenosis C5-6 and C6-7.  IMPRESSION: 1. No acute intracranial abnormality. 2. No acute abnormality of the cervical spine. Multilevel degenerative disc and joint disease with severe foraminal stenoses at multiple levels.   Electronically Signed   By: Francene Boyers M.D.   On: 02/04/2015 21:14   Ct Knee Left Wo Contrast  02/04/2015   CLINICAL DATA:  Pain after trip and fall today  EXAM: CT OF THE left KNEE WITHOUT CONTRAST  TECHNIQUE: Multidetector CT imaging of the left knee was performed according to the standard protocol. Multiplanar CT image reconstructions were also generated.  COMPARISON:  Radiographs 02/04/2015  FINDINGS: There is no fracture. There is no dislocation. No acute soft tissue abnormality is evident, except for mild skin thickening and subcutaneous edema in the prepatellar region. There is no effusion.  IMPRESSION: Negative for acute fracture. Mild soft tissue swelling in the prepatellar region consistent with a superficial contusion.   Electronically Signed   By: Ellery Plunk M.D.   On: 02/04/2015 20:48   Dg Knee Complete 4 Views Left  02/04/2015   CLINICAL DATA:  Fall. Left knee injury and pain. Initial encounter.  EXAM: LEFT KNEE - COMPLETE 4+ VIEW  COMPARISON:  None.  FINDINGS: There is no evidence of fracture, dislocation, or joint effusion. There is no evidence of arthropathy or other focal bone abnormality. Mild prepatellar soft tissue swelling is noted.  IMPRESSION: Prepatellar soft tissue swelling. No evidence of fracture, dislocation, or knee joint effusion.   Electronically Signed   By: Myles Rosenthal M.D.   On: 02/04/2015 19:07   Dg Humerus Right  02/04/2015   CLINICAL DATA:  Fall, right shoulder pain  EXAM: RIGHT HUMERUS - 2+ VIEW  COMPARISON:  None.  FINDINGS: Right humeral and glenoid bone anchors are identified. There is a mildly impacted fracture of the proximal right humerus with adjacent soft tissue swelling.  IMPRESSION: Mildly  impacted fracture of the proximal right humerus involving the humeral head.   Electronically Signed   By: Christiana Pellant M.D.   On: 02/04/2015 19:07   EKG: Independently reviewed. normal sinus rhythm, nonspecific ST and T waves changes.  Assessment/Plan Principal Problem:   Dizziness Active Problems:   Fracture of right humerus   1. Dizziness The patient is presenting with complaints of dizziness and had a mechanical fall. She denies any symptoms prior to the fall and the dizziness started only after the fall. Her CT scan of the head as well as neck does not show any acute abnormality. With this the most likely cause for her dizziness is a narcotic  medication that she has received or concussion injury. At present she will be admitted in the hospital we will get PTOT evaluation. Monitor her on telemetry overnight. If her dizziness does not improve she may require MRI of the brain. Serial neuro checks overnight.  2. Fracture of the humerus. In ED has discussed with orthopedics and recommended non-surgical management. Continue close monitoring.  Advance goals of care discussion: Full code   Consults: ED physician discussed with Dr. Eulah PontMurphy from orthopedics  DVT Prophylaxis: subcutaneous Heparin Nutrition: Regular diet  Family Communication: family was present at bedside, opportunity was given to ask question and all questions were answered satisfactorily at the time of interview. Disposition: Admitted as observation, telemetry unit.  Author: Lynden OxfordPranav Ponciano Shealy, MD [facility] Pager: [phone] 02/04/2015  If 7PM-7AM, please contact night-coverage [email or web address] Password [email or web address]

## 2015-02-04 NOTE — ED Notes (Signed)
Swelling noted to right shoulder. Brusiing to left forearm. Pt c/o "pins and needles to my left knee"

## 2015-02-04 NOTE — ED Notes (Signed)
Pt c/o right shoulder pain with possible fracture with left leg pain and hematoma to right side of head; CMS intact; pt appears in large amount of pain; injury after trip and fall today

## 2015-02-04 NOTE — ED Provider Notes (Signed)
CSN: [id number]     Arrival date & time 02/04/15  1650 History   First MD Initiated Contact with Patient 02/04/15 1707     Chief Complaint  Patient presents with  . Fall     (Consider location/radiation/quality/duration/timing/severity/associated sxs/prior Treatment) HPI Comments: Patient from home after mechanical fall. She states she tripped in the kitchen and struck her head, right shoulder, left knee on the ground. She denies any preceding dizziness or lightheadedness. No abdominal pain or back pain. Complains of severe pain to her right shoulder, left forearm, left knee. He does not take any anticoagulants. She denies any preceding dizziness or lightheadedness. She denies any chest pain or shortness of breath. He has no focal weakness, numbness or tingling. No bowel or bladder incontinence.  The history is provided by the patient and a relative.    History reviewed. No pertinent past medical history. History reviewed. No pertinent past surgical history. History reviewed. No pertinent family history. History  Substance Use Topics  . Smoking status: Never Smoker   . Smokeless tobacco: Not on file  . Alcohol Use: Not on file   OB History    No data available     Review of Systems  Constitutional: Negative for fever, activity change and appetite change.  HENT: Negative for congestion and rhinorrhea.   Respiratory: Negative for cough, chest tightness and shortness of breath.   Cardiovascular: Negative for chest pain.  Gastrointestinal: Negative for nausea, vomiting and abdominal pain.  Genitourinary: Negative for dysuria, hematuria, vaginal bleeding and vaginal discharge.  Musculoskeletal: Positive for myalgias and arthralgias. Negative for back pain.  Skin: Negative for rash.  Neurological: Negative for facial asymmetry, weakness and numbness.  A complete 10 system review of systems was obtained and all systems are negative except as noted in the HPI and PMH.      Allergies   Penicillins  Home Medications   Prior to Admission medications   Not on File   BP 152/73 mmHg  Pulse 76  Temp(Src) 97.8 F (36.6 C) (Oral)  Resp 17  Wt 207 lb 0.2 oz (93.9 kg)  SpO2 96% Physical Exam  Constitutional: She is oriented to person, place, and time. She appears well-developed and well-nourished. No distress.  HENT:  Head: Normocephalic and atraumatic.  Mouth/Throat: Oropharynx is clear and moist. No oropharyngeal exudate.  Hematoma right forehead  Eyes: Conjunctivae and EOM are normal. Pupils are equal, round, and reactive to light.  Neck: Normal range of motion. Neck supple.  Paraspinal cervical tenderness  Cardiovascular: Normal rate, regular rhythm, normal heart sounds and intact distal pulses.   No murmur heard. Pulmonary/Chest: Effort normal and breath sounds normal. No respiratory distress.  Abdominal: Soft. There is no tenderness. There is no rebound and no guarding.  Musculoskeletal: She exhibits edema and tenderness.  Diffuse tenderness to right shoulder, no bruising. Bruising to left forearm and elbow. Intact radial pulses bilaterally. Ecchymosis and bruising the left knee. Full range of motion of hips bilaterally. Full range of motion of knees bilaterally. Intact DP and PT pulses.  Neurological: She is alert and oriented to person, place, and time. No cranial nerve deficit. She exhibits normal muscle tone. Coordination normal.  No ataxia on finger to nose bilaterally. No pronator drift. 5/5 strength throughout. CN 2-12 intact. Equal grip strength. Sensation intact.  Skin: Skin is warm.  Psychiatric: She has a normal mood and affect. Her behavior is normal.  Nursing note and vitals reviewed.   ED Course  Procedures (including critical  care time) Labs Review Labs Reviewed  I-STAT CHEM 8, ED - Abnormal; Notable for the following:    BUN 24 (*)    Glucose, Bld 119 (*)    Calcium, Ion 1.12 (*)    Hemoglobin 15.3 (*)    All other components within normal  limits  COMPREHENSIVE METABOLIC PANEL  CBC WITH DIFFERENTIAL/PLATELET  TSH  URINALYSIS, ROUTINE W REFLEX MICROSCOPIC  I-STAT TROPOININ, ED    Imaging Review Dg Chest 2 View  02/04/2015   CLINICAL DATA:  Right shoulder pain after falling today  EXAM: CHEST  2 VIEW  COMPARISON:  05/21/2005  FINDINGS: The heart size and mediastinal contours are within normal limits. Both lungs are clear. The visualized skeletal structures are unremarkable. There is a proximal right humeral fracture.  IMPRESSION: No active cardiopulmonary disease.  Proximal right humeral fracture.   Electronically Signed   By: Ellery Plunkaniel R Mitchell M.D.   On: 02/04/2015 19:08   Dg Shoulder Right  02/04/2015   CLINICAL DATA:  Right shoulder pain secondary to a fall today.  EXAM: RIGHT SHOULDER - 2+ VIEW  COMPARISON:  Humerus radiographs dated 02/04/2015  FINDINGS: There is a slightly impacted fracture of the right humeral neck with what appears to be avulsion of a portion of the greater tuberosity. No dislocation.  Surgical anchors from previous rotator cuff surgery.  IMPRESSION: Comminuted fracture of the proximal humerus.   Electronically Signed   By: Francene BoyersJames  Maxwell M.D.   On: 02/04/2015 19:08   Dg Elbow 2 Views Left  02/04/2015   CLINICAL DATA:  Fall, left elbow pain  EXAM: LEFT ELBOW - 2 VIEW  COMPARISON:  Forearm radiographs same date  FINDINGS: There is no evidence of fracture, dislocation, or joint effusion. There is no evidence of arthropathy or other focal bone abnormality. Soft tissues are unremarkable.  IMPRESSION: Negative.   Electronically Signed   By: Christiana PellantGretchen  Green M.D.   On: 02/04/2015 19:08   Dg Forearm Left  02/04/2015   CLINICAL DATA:  Pain after falling today  EXAM: LEFT FOREARM - 2 VIEW  COMPARISON:  None.  FINDINGS: There is no evidence of fracture or other focal bone lesions. Soft tissues are unremarkable.  IMPRESSION: Negative.   Electronically Signed   By: Ellery Plunkaniel R Mitchell M.D.   On: 02/04/2015 19:07   Ct Head Wo  Contrast  02/04/2015   CLINICAL DATA:  The patient fell and struck the right side of his head. Headache. Neck pain extending into the right shoulder.  EXAM: CT HEAD WITHOUT CONTRAST  CT CERVICAL SPINE WITHOUT CONTRAST  TECHNIQUE: Multidetector CT imaging of the head and cervical spine was performed following the standard protocol without intravenous contrast. Multiplanar CT image reconstructions of the cervical spine were also generated.  COMPARISON:  None.  FINDINGS: CT HEAD FINDINGS  No mass lesion. No midline shift. No acute hemorrhage or hematoma. No extra-axial fluid collections. No evidence of acute infarction. Brain parenchyma is normal. No osseous abnormality.  CT CERVICAL SPINE FINDINGS  There is no fracture or acute subluxation. There is multilevel degenerative disc and joint disease throughout the cervical spine. No prevertebral soft tissue swelling. There is fairly severe right foraminal stenosis at C3-4 and C4-5 and severe bilateral foraminal stenosis C5-6 and C6-7.  IMPRESSION: 1. No acute intracranial abnormality. 2. No acute abnormality of the cervical spine. Multilevel degenerative disc and joint disease with severe foraminal stenoses at multiple levels.   Electronically Signed   By: Francene BoyersJames  Maxwell M.D.   On:  02/04/2015 21:14   Ct Cervical Spine Wo Contrast  02/04/2015   CLINICAL DATA:  The patient fell and struck the right side of his head. Headache. Neck pain extending into the right shoulder.  EXAM: CT HEAD WITHOUT CONTRAST  CT CERVICAL SPINE WITHOUT CONTRAST  TECHNIQUE: Multidetector CT imaging of the head and cervical spine was performed following the standard protocol without intravenous contrast. Multiplanar CT image reconstructions of the cervical spine were also generated.  COMPARISON:  None.  FINDINGS: CT HEAD FINDINGS  No mass lesion. No midline shift. No acute hemorrhage or hematoma. No extra-axial fluid collections. No evidence of acute infarction. Brain parenchyma is normal. No  osseous abnormality.  CT CERVICAL SPINE FINDINGS  There is no fracture or acute subluxation. There is multilevel degenerative disc and joint disease throughout the cervical spine. No prevertebral soft tissue swelling. There is fairly severe right foraminal stenosis at C3-4 and C4-5 and severe bilateral foraminal stenosis C5-6 and C6-7.  IMPRESSION: 1. No acute intracranial abnormality. 2. No acute abnormality of the cervical spine. Multilevel degenerative disc and joint disease with severe foraminal stenoses at multiple levels.   Electronically Signed   By: Francene Boyers M.D.   On: 02/04/2015 21:14   Ct Knee Left Wo Contrast  02/04/2015   CLINICAL DATA:  Pain after trip and fall today  EXAM: CT OF THE left KNEE WITHOUT CONTRAST  TECHNIQUE: Multidetector CT imaging of the left knee was performed according to the standard protocol. Multiplanar CT image reconstructions were also generated.  COMPARISON:  Radiographs 02/04/2015  FINDINGS: There is no fracture. There is no dislocation. No acute soft tissue abnormality is evident, except for mild skin thickening and subcutaneous edema in the prepatellar region. There is no effusion.  IMPRESSION: Negative for acute fracture. Mild soft tissue swelling in the prepatellar region consistent with a superficial contusion.   Electronically Signed   By: Ellery Plunk M.D.   On: 02/04/2015 20:48   Dg Knee Complete 4 Views Left  02/04/2015   CLINICAL DATA:  Fall. Left knee injury and pain. Initial encounter.  EXAM: LEFT KNEE - COMPLETE 4+ VIEW  COMPARISON:  None.  FINDINGS: There is no evidence of fracture, dislocation, or joint effusion. There is no evidence of arthropathy or other focal bone abnormality. Mild prepatellar soft tissue swelling is noted.  IMPRESSION: Prepatellar soft tissue swelling. No evidence of fracture, dislocation, or knee joint effusion.   Electronically Signed   By: Myles Rosenthal M.D.   On: 02/04/2015 19:07   Dg Humerus Right  02/04/2015   CLINICAL  DATA:  Fall, right shoulder pain  EXAM: RIGHT HUMERUS - 2+ VIEW  COMPARISON:  None.  FINDINGS: Right humeral and glenoid bone anchors are identified. There is a mildly impacted fracture of the proximal right humerus with adjacent soft tissue swelling.  IMPRESSION: Mildly impacted fracture of the proximal right humerus involving the humeral head.   Electronically Signed   By: Christiana Pellant M.D.   On: 02/04/2015 19:07     EKG Interpretation   Date/Time:  Monday Feb 04 2015 17:55:04 EDT Ventricular Rate:  76 PR Interval:  177 QRS Duration: 90 QT Interval:  402 QTC Calculation: 452 R Axis:   40 Text Interpretation:  Sinus rhythm Abnormal R-wave progression, early  transition No significant change was found Confirmed by Manus Gunning  MD,  Gannon Heinzman [phone]) on 02/04/2015 6:09:02 PM      MDM   Final diagnoses:  Dizziness  Proximal humerus fracture, right, closed, initial  encounter   Mechanical fall with multiple injuries. Pain to head, right shoulder, left knee and left forearm. No anticoagulant use. Neurovascularly intact  X-ray shows right proximal humerus fracture. Patient is neurovascularly intact. CT head and C-spine are negative. Patient placed in shoulder immobilizer.  Discussed with Dr. Eulah Pont who agrees patient is appropriate for outpatient follow-up and he will see in the office in 2 days.  Patient and family are worried about her going home. She is having difficulty walking and feels dizzy and lightheaded. She is requiring assistance with walking and normally does not. She cannot make it to her house on her own she states. Observation admission d/w Dr. Allena Katz. It is unclear if her dizziness is from her head injury or from the pain medication she received.   Glynn Octave, MD 02/05/15 [phone]

## 2015-02-05 DIAGNOSIS — S42301A Unspecified fracture of shaft of humerus, right arm, initial encounter for closed fracture: Secondary | ICD-10-CM | POA: Diagnosis not present

## 2015-02-05 DIAGNOSIS — R42 Dizziness and giddiness: Secondary | ICD-10-CM | POA: Diagnosis not present

## 2015-02-05 LAB — COMPREHENSIVE METABOLIC PANEL
ALT: 35 U/L (ref 14–54)
ANION GAP: 7 (ref 5–15)
AST: 77 U/L — ABNORMAL HIGH (ref 15–41)
Albumin: 3.3 g/dL — ABNORMAL LOW (ref 3.5–5.0)
Alkaline Phosphatase: 56 U/L (ref 38–126)
BUN: 18 mg/dL (ref 6–20)
CO2: 23 mmol/L (ref 22–32)
CREATININE: 0.87 mg/dL (ref 0.44–1.00)
Calcium: 8.8 mg/dL — ABNORMAL LOW (ref 8.9–10.3)
Chloride: 108 mmol/L (ref 101–111)
GFR calc Af Amer: >60 mL/min (ref >60)
Glucose, Bld: 106 mg/dL — ABNORMAL HIGH (ref 70–99)
Potassium: 3.8 mmol/L (ref 3.5–5.1)
Sodium: 138 mmol/L (ref 135–145)
TOTAL PROTEIN: 6.6 g/dL (ref 6.5–8.1)
Total Bilirubin: 0.7 mg/dL (ref 0.3–1.2)

## 2015-02-05 LAB — CBC WITH DIFFERENTIAL/PLATELET
BASOS PCT: 0 % (ref 0–1)
Basophils Absolute: 0 10*3/uL (ref 0.0–0.1)
Eosinophils Absolute: 0 10*3/uL (ref 0.0–0.7)
Eosinophils Relative: 0 % (ref 0–5)
HEMATOCRIT: 43.1 % (ref 36.0–46.0)
Hemoglobin: 14.1 g/dL (ref 12.0–15.0)
LYMPHS PCT: 17 % (ref 12–46)
Lymphs Abs: 1.5 10*3/uL (ref 0.7–4.0)
MCH: 31 pg (ref 26.0–34.0)
MCHC: 32.7 g/dL (ref 30.0–36.0)
MCV: 94.7 fL (ref 78.0–100.0)
MONO ABS: 0.9 10*3/uL (ref 0.1–1.0)
Monocytes Relative: 10 % (ref 3–12)
NEUTROS PCT: 73 % (ref 43–77)
Neutro Abs: 6.4 10*3/uL (ref 1.7–7.7)
PLATELETS: 218 10*3/uL (ref 150–400)
RBC: 4.55 MIL/uL (ref 3.87–5.11)
RDW: 13.4 % (ref 11.5–15.5)
WBC: 8.7 10*3/uL (ref 4.0–10.5)

## 2015-02-05 LAB — URINALYSIS, ROUTINE W REFLEX MICROSCOPIC
Bilirubin Urine: NEGATIVE
GLUCOSE, UA: NEGATIVE mg/dL
Hgb urine dipstick: NEGATIVE
Ketones, ur: NEGATIVE mg/dL
Leukocytes, UA: NEGATIVE
Nitrite: NEGATIVE
Protein, ur: NEGATIVE mg/dL
Specific Gravity, Urine: 1.007 (ref 1.005–1.030)
Urobilinogen, UA: 0.2 mg/dL (ref 0.0–1.0)
pH: 6 (ref 5.0–8.0)

## 2015-02-05 LAB — TSH: TSH: 1.389 u[IU]/mL (ref 0.350–4.500)

## 2015-02-05 MED ORDER — ONDANSETRON HCL 4 MG PO TABS: 4.0000 mg | ORAL_TABLET | Freq: Four times a day (QID) | ORAL | Status: DC | PRN

## 2015-02-05 MED ORDER — HYDROCODONE-ACETAMINOPHEN 5-325 MG PO TABS: 1.0000 | ORAL_TABLET | ORAL | Status: DC | PRN

## 2015-02-05 MED ORDER — SENNOSIDES-DOCUSATE SODIUM 8.6-50 MG PO TABS: 1.0000 | ORAL_TABLET | Freq: Every evening | ORAL | Status: DC | PRN

## 2015-02-05 NOTE — Progress Notes (Signed)
Attempted to make a follow up appointment with pt PCP. The office said they will contact patient and make the appointment.

## 2015-02-05 NOTE — Discharge Instructions (Signed)
Benign Positional Vertigo Vertigo means you feel like you or your surroundings are moving when they are not. Benign positional vertigo is the most common form of vertigo. Benign means that the cause of your condition is not serious. Benign positional vertigo is more common in older adults. CAUSES  Benign positional vertigo is the result of an upset in the labyrinth system. This is an area in the middle ear that helps control your balance. This may be caused by a viral infection, head injury, or repetitive motion. However, often no specific cause is found. SYMPTOMS  Symptoms of benign positional vertigo occur when you move your head or eyes in different directions. Some of the symptoms may include:  Loss of balance and falls.  Vomiting.  Blurred vision.  Dizziness.  Nausea.  Involuntary eye movements (nystagmus). DIAGNOSIS  Benign positional vertigo is usually diagnosed by physical exam. If the specific cause of your benign positional vertigo is unknown, your caregiver may perform imaging tests, such as magnetic resonance imaging (MRI) or computed tomography (CT). TREATMENT  Your caregiver may recommend movements or procedures to correct the benign positional vertigo. Medicines such as meclizine, benzodiazepines, and medicines for nausea may be used to treat your symptoms. In rare cases, if your symptoms are caused by certain conditions that affect the inner ear, you may need surgery. HOME CARE INSTRUCTIONS   Follow your caregiver's instructions.  Move slowly. Do not make sudden body or head movements.  Avoid driving.  Avoid operating heavy machinery.  Avoid performing any tasks that would be dangerous to you or others during a vertigo episode.  Drink enough fluids to keep your urine clear or pale yellow. SEEK IMMEDIATE MEDICAL CARE IF:   You develop problems with walking, weakness, numbness, or using your arms, hands, or legs.  You have difficulty speaking.  You develop  severe headaches.  Your nausea or vomiting continues or gets worse.  You develop visual changes.  Your family or friends notice any behavioral changes.  Your condition gets worse.  You have a fever.  You develop a stiff neck or sensitivity to light. MAKE SURE YOU:   Understand these instructions.  Will watch your condition.  Will get help right away if you are not doing well or get worse. Document Released: 06/22/2006 Document Revised: 12/07/2011 Document Reviewed: 06/04/2011 [facility] Patient Information 2015 [place], [place]. This information is not intended to replace advice given to you by your health care provider. Make sure you discuss any questions you have with your health care provider.  Cast or Splint Care Casts and splints support injured limbs and keep bones from moving while they heal.  HOME CARE  Keep the cast or splint uncovered during the drying period.  A plaster cast can take 24 to 48 hours to dry.  A fiberglass cast will dry in less than 1 hour.  Do not rest the cast on anything harder than a pillow for 24 hours.  Do not put weight on your injured limb. Do not put pressure on the cast. Wait for your doctor's approval.  Keep the cast or splint dry.  Cover the cast or splint with a plastic bag during baths or wet weather.  If you have a cast over your chest and belly (trunk), take sponge baths until the cast is taken off.  If your cast gets wet, dry it with a towel or blow dryer. Use the cool setting on the blow dryer.  Keep your cast or splint clean. Wash a dirty  cast with a damp cloth.  Do not put any objects under your cast or splint.  Do not scratch the skin under the cast with an object. If itching is a problem, use a blow dryer on a cool setting over the itchy area.  Do not trim or cut your cast.  Do not take out the padding from inside your cast.  Exercise your joints near the cast as told by your doctor.  Raise (elevate) your injured  limb on 1 or 2 pillows for the first 1 to 3 days. GET HELP IF:  Your cast or splint cracks.  Your cast or splint is too tight or too loose.  You itch badly under the cast.  Your cast gets wet or has a soft spot.  You have a bad smell coming from the cast.  You get an object stuck under the cast.  Your skin around the cast becomes red or sore.  You have new or more pain after the cast is put on. GET HELP RIGHT AWAY IF:  You have fluid leaking through the cast.  You cannot move your fingers or toes.  Your fingers or toes turn blue or white or are cool, painful, or puffy (swollen).  You have tingling or lose feeling (numbness) around the injured area.  You have bad pain or pressure under the cast.  You have trouble breathing or have shortness of breath.  You have chest pain. Document Released: 01/14/2011 Document Revised: 05/17/2013 Document Reviewed: 03/23/2013 [facility] Patient Information 2015 [place], [place]. This information is not intended to replace advice given to you by your health care provider. Make sure you discuss any questions you have with your health care provider.

## 2015-02-05 NOTE — Evaluation (Signed)
Physical Therapy Evaluation Patient Details Name: Rodney Boozerlis M Madrigal MRN: [id number] DOB: [birth year] Today's Date: 02/05/2015   History of Present Illness  Pt s/p mechanical fall presenting with R humerus fracture. Plan in management non-operatively and in a sling with R UE NWB.   Clinical Impression  Asked to eval patient for vestibular dysfunction due to c/o dizziness however pt denied dizziness throughout session. Assessed pt for BPPV however no nystagmus with horizontal head turns or flexion/extension of head. Pt able to ambulate with min guard with use of straight cane and demo'd safe stair negotiation to get into home. Daughter present to provide 24/7 assist upon d/c home. Recommend OT consult to address bathing/dressing with R UE NWB.    Follow Up Recommendations Home health PT;Supervision/Assistance - 24 hour    Equipment Recommendations  Cane    Recommendations for Other Services OT consult     Precautions / Restrictions Precautions Precautions: Fall Required Braces or Orthoses: Sling Restrictions Weight Bearing Restrictions: Yes RUE Weight Bearing: Non weight bearing      Mobility  Bed Mobility Overal bed mobility: Needs Assistance Bed Mobility: Supine to Sit     Supine to sit: Mod assist;Max assist     General bed mobility comments: pt used PT to pull self to EOB  Transfers Overall transfer level: Needs assistance Equipment used:  (1 person to pull up on) Transfers: Sit to/from UGI CorporationStand;Stand Pivot Transfers Sit to Stand: Mod assist Stand pivot transfers: Min assist       General transfer comment: pt used PT as brace to pull up on when getting up from bed due to inability to push up with LUE. When pt on BSC pt able to push up from hand rail with min guard  Ambulation/Gait Ambulation/Gait assistance: Min guard Ambulation Distance (Feet): 200 Feet Assistive device: Straight cane Gait Pattern/deviations: Step-through pattern;Decreased stride length;Wide base of  support (unsteady when not using cane properly) Gait velocity: decreased Gait velocity interpretation: Below normal speed for age/gender General Gait Details: initially used L HHA however noted pt relying on PT and transitioned pt to cane. pt became efficient and safe with cane s/p v/c's for proper sequencing.  Stairs Stairs: Yes Stairs assistance: Min guard Stair Management: One rail Left Number of Stairs: 2 (x3 trials) General stair comments: v/c's for seqencing but no physically assist required  Wheelchair Mobility    Modified Rankin (Stroke Patients Only)       Balance Overall balance assessment: Needs assistance   Sitting balance-Leahy Scale: Good     Standing balance support: During functional activity Standing balance-Leahy Scale: Fair Standing balance comment: pt able to stand with min guard and perform hygiene s/p tolieting in front of BSC                             Pertinent Vitals/Pain Pain Assessment: No/denies pain    Home Living Family/patient expects to be discharged to:: Private residence Living Arrangements: Children Available Help at Discharge: Family;Available 24 hours/day Type of Home: House Home Access: Stairs to enter Entrance Stairs-Rails: Can reach both Entrance Stairs-Number of Steps: 5 Home Layout: One level Home Equipment: Walker - 2 wheels      Prior Function Level of Independence: Independent               Hand Dominance   Dominant Hand: Right    Extremity/Trunk Assessment   Upper Extremity Assessment: RUE deficits/detail RUE Deficits / Details: in sling, can wiggle  fingers         Lower Extremity Assessment: Generalized weakness      Cervical / Trunk Assessment: Normal  Communication   Communication: No difficulties  Cognition Arousal/Alertness: Awake/alert Behavior During Therapy: WFL for tasks assessed/performed Overall Cognitive Status: Within Functional Limits for tasks assessed                       General Comments      Exercises        Assessment/Plan    PT Assessment Patient needs continued PT services  PT Diagnosis Difficulty walking   PT Problem List Decreased strength;Decreased range of motion;Decreased activity tolerance;Decreased balance;Decreased mobility  PT Treatment Interventions DME instruction;Gait training;Stair training;Functional mobility training;Therapeutic activities;Therapeutic exercise;Balance training   PT Goals (Current goals can be found in the Care Plan section) Acute Rehab PT Goals Patient Stated Goal: home today PT Goal Formulation: With patient Time For Goal Achievement: 02/12/15 Potential to Achieve Goals: Good    Frequency Min 4X/week   Barriers to discharge        Co-evaluation               End of Session Equipment Utilized During Treatment: Gait belt (R UE Sling) Activity Tolerance: Patient tolerated treatment well Patient left: in chair;with call bell/phone within reach;with family/visitor present Nurse Communication: Mobility status    Functional Assessment Tool Used: clinical judgment Functional Limitation: Mobility: Walking and moving around Mobility: Walking and Moving Around Current Status [phone](G8978): At least 40 percent but less than 60 percent impaired, limited or restricted Mobility: Walking and Moving Around Goal Status [phone](G8979): At least 1 percent but less than 20 percent impaired, limited or restricted    Time: 1435-1514 PT Time Calculation (min) (ACUTE ONLY): 39 min   Charges:   PT Evaluation $Initial PT Evaluation Tier I: 1 Procedure PT Treatments $Gait Training: 8-22 mins $Therapeutic Activity: 8-22 mins   PT G Codes:   PT G-Codes **NOT FOR INPATIENT CLASS** Functional Assessment Tool Used: clinical judgment Functional Limitation: Mobility: Walking and moving around Mobility: Walking and Moving Around Current Status ([id number](G8978): At least 40 percent but less than 60 percent impaired, limited or  restricted Mobility: Walking and Moving Around Goal Status [phone]([id number]): At least 1 percent but less than 20 percent impaired, limited or restricted    Marcene BrawnChadwell, Allye Hoyos Marie 02/05/2015, 4:34 PM   Lewis ShockAshly Laporshia Hogen, PT, DPT Pager #: [phone] Office #: [phone]

## 2015-02-05 NOTE — Evaluation (Addendum)
Occupational Therapy Evaluation Patient Details Name: Angela Leach MRN: [id number] DOB: [birth year] Today's Date: 02/05/2015    History of Present Illness Pt s/p mechanical fall presenting with R humerus fracture. Plan in management non-operatively and in a sling with R UE NWB.    Clinical Impression   Pt admitted with above. Education provided in session and handout given. Pt's daughter available to assist pt at home. Pt does not want home health therapy at home and daughter verbalized understanding of information covered in session. Pt planning to leave today.    Follow Up Recommendations  No OT follow up;Supervision/Assistance - 24 hour    Equipment Recommendations  None recommended by OT    Recommendations for Other Services       Precautions / Restrictions Precautions Precautions: Fall Precaution Comments: educated on precautions; no elbow or shoulder ROM; no pushing, pulling, lifting with right upper extremity (can use to hold light items) Required Braces or Orthoses: Sling (on at all times except for bathing/dressing) Restrictions Weight Bearing Restrictions: Yes RUE Weight Bearing: Non weight bearing      Mobility Bed Mobility Not assessed  Transfers Overall transfer level: Needs assistance  Transfers: Sit to/from Stand Sit to Stand: Min guard       General transfer comment: [facility]         ADL Overall ADL's : Needs assistance/impaired                 Upper Body Dressing : Sitting;Maximal assistance       Toilet Transfer: Min guard;BSC (cane)   Toileting- Clothing Manipulation and Hygiene: Min guard;Sit to/from stand       Functional mobility during ADLs: Min guard;Cane General ADL Comments: Educated on sling and how to manage with right upper extremity. Pt' s daughter available to help pt at home. Pt has shower chair and OT recommended her use this for LB bathing. Pt did not feel need to practice shower transfer and thinks  her and her daughter can handle this. Discussed UB clothing that may be easier to manage.     Vision     Perception     Praxis      Pertinent Vitals/Pain Pain Assessment: 0-10 Pain Score: 10-Worst pain ever Pain Location: right upper extremity Pain Descriptors / Indicators:  ("hurts") Pain Intervention(s): Monitored during session     Hand Dominance Right   Extremity/Trunk Assessment Upper Extremity Assessment Upper Extremity Assessment: RUE deficits/detail RUE Deficits / Details: can move wrist/fingers   Lower Extremity Assessment Lower Extremity Assessment: Defer to PT evaluation   Cervical / Trunk Assessment Cervical / Trunk Assessment: Normal   Communication Communication Communication: No difficulties   Cognition Arousal/Alertness: Awake/alert Behavior During Therapy: WFL for tasks assessed/performed Overall Cognitive Status: Within Functional Limits for tasks assessed                     General Comments       Exercises Exercises: Other exercises;Shoulder Other Exercises Other Exercises: pt performed right wrist flexion/extension and moved digits   Shoulder Instructions Shoulder Instructions-daughter verbalized understanding of information covered Donning/doffing shirt without moving shoulder:  (educated) Method for sponge bathing under operated UE:  (educated) Donning/doffing sling/immobilizer:  (educated and OT demonstrated) Correct positioning of sling/immobilizer:  (educated and OT demonstrated) ROM for elbow, wrist and digits of operated UE:  (performed wrist and hand AROM without physical assist) Sling wearing schedule (on at all times/off for ADL's):  (educated) Proper positioning of  operated UE when showering:  (educated) Positioning of UE while sleeping:  (educated)    Home Living Family/patient expects to be discharged to:: Private residence Living Arrangements: Children Available Help at Discharge: Family;Available 24 hours/day Type  of Home: House Home Access: Stairs to enter Enter[employer] CorporationEntrance Stairs-Number of Steps: 5 Entrance Stairs-Rails: Can reach both Home Layout: One level     Bathroom Shower/Tub: Walk-in shower         Home Equipment: Environmental consultantWalker - 2 wheels;Shower seat;Bedside commode          Prior Functioning/Environment Level of Independence: Independent             OT Diagnosis: Acute pain   OT Problem List:     OT Treatment/Interventions:      OT Goals(Current goals can be found in the care plan section) Acute Rehab OT Goals Patient Stated Goal: go home  OT Frequency:     Barriers to D/C:            Co-evaluation              End of Session Equipment Utilized During Treatment: Other (comment) (sling) Nurse Communication: Other (comment) (ready for d/c)  Activity Tolerance: Patient tolerated treatment well Patient left: in chair;with family/visitor present   Time: [id number] OT Time Calculation (min): 14 min Charges:  OT General Charges $OT Visit: 1 Procedure OT Evaluation $Initial OT Evaluation Tier I: 1 Procedure G-Codes: OT G-codes **NOT FOR INPATIENT CLASS** Functional Assessment Tool Used: clinical judgment Functional Limitation: Self care Self Care Current Status ([id number](G8987): At least 40 percent but less than 60 percent impaired, limited or restricted Self Care Goal Status ([id number](G8988): At least 40 percent but less than 60 percent impaired, limited or restricted Self Care Discharge Status [phone]([id number]): At least 40 percent but less than 60 percent impaired, limited or restricted  Earlie RavelingStraub, Zinia Innocent L OTR/L [phone]  02/05/2015, 5:35 PM

## 2015-02-05 NOTE — Progress Notes (Addendum)
Progress Note   Rodney Boozerlis M Madl ZOX:[id number] DOB: Jan 12, 1933 DOA: 02/04/2015 PCP: Lupe Carneyean Mitchell, MD     Brief Narrative:   Rodney Boozerlis M Laux is an 81 y.o. female with no significant PMH who was admitted 02/04/15 with a chief complaint of dizziness/lightheadedness after suffering from a mechanical fall. On admission, CT of the head was negative for acute abnormalities and she was found to have a fracture of the right humerus.  Assessment/Plan:   Principal Problem:   Dizziness - CT of the head negative for acute findings. - May be a side effect of narcotic pain medications. - PT/OT evaluations pending.  Symptoms seem to be consistent with vertigo, will get vestibular PT evaluation. - Being monitored on telemetry. - Not orthostatic.  Active Problems:   Fracture of right humerus secondary to mechanical fall - Nonsurgical management recommended by Dr. Eulah PontMurphy, orthopedic surgeon on call, after discussion with EDP. - PT/OT evaluations requested. - Continue sling RUE.    DVT Prophylaxis - Continue heparin.  Code Status: Full. Family Communication: Lanora Manislizabeth, daughter, at bedside. Disposition Plan: Admitted as observation. Home when dizziness improved and PT/OT evaluations completed, likely 02/06/15.   IV Access:    Peripheral IV   Procedures and diagnostic studies:   Dg Chest 2 View  02/04/2015   CLINICAL DATA:  Right shoulder pain after falling today  EXAM: CHEST  2 VIEW  COMPARISON:  05/21/2005  FINDINGS: The heart size and mediastinal contours are within normal limits. Both lungs are clear. The visualized skeletal structures are unremarkable. There is a proximal right humeral fracture.  IMPRESSION: No active cardiopulmonary disease.  Proximal right humeral fracture.   Electronically Signed   By: Ellery Plunkaniel R Mitchell M.D.   On: 02/04/2015 19:08   Dg Shoulder Right  02/04/2015   CLINICAL DATA:  Right shoulder pain secondary to a fall today.  EXAM: RIGHT SHOULDER - 2+ VIEW   COMPARISON:  Humerus radiographs dated 02/04/2015  FINDINGS: There is a slightly impacted fracture of the right humeral neck with what appears to be avulsion of a portion of the greater tuberosity. No dislocation.  Surgical anchors from previous rotator cuff surgery.  IMPRESSION: Comminuted fracture of the proximal humerus.   Electronically Signed   By: Francene BoyersJames  Maxwell M.D.   On: 02/04/2015 19:08   Dg Elbow 2 Views Left  02/04/2015   CLINICAL DATA:  Fall, left elbow pain  EXAM: LEFT ELBOW - 2 VIEW  COMPARISON:  Forearm radiographs same date  FINDINGS: There is no evidence of fracture, dislocation, or joint effusion. There is no evidence of arthropathy or other focal bone abnormality. Soft tissues are unremarkable.  IMPRESSION: Negative.   Electronically Signed   By: Christiana PellantGretchen  Green M.D.   On: 02/04/2015 19:08   Dg Forearm Left  02/04/2015   CLINICAL DATA:  Pain after falling today  EXAM: LEFT FOREARM - 2 VIEW  COMPARISON:  None.  FINDINGS: There is no evidence of fracture or other focal bone lesions. Soft tissues are unremarkable.  IMPRESSION: Negative.   Electronically Signed   By: Ellery Plunkaniel R Mitchell M.D.   On: 02/04/2015 19:07   Ct Head Wo Contrast  02/04/2015   CLINICAL DATA:  The patient fell and struck the right side of his head. Headache. Neck pain extending into the right shoulder.  EXAM: CT HEAD WITHOUT CONTRAST  CT CERVICAL SPINE WITHOUT CONTRAST  TECHNIQUE: Multidetector CT imaging of the head and cervical spine was performed following the standard protocol without intravenous contrast.  Multiplanar CT image reconstructions of the cervical spine were also generated.  COMPARISON:  None.  FINDINGS: CT HEAD FINDINGS  No mass lesion. No midline shift. No acute hemorrhage or hematoma. No extra-axial fluid collections. No evidence of acute infarction. Brain parenchyma is normal. No osseous abnormality.  CT CERVICAL SPINE FINDINGS  There is no fracture or acute subluxation. There is multilevel degenerative  disc and joint disease throughout the cervical spine. No prevertebral soft tissue swelling. There is fairly severe right foraminal stenosis at C3-4 and C4-5 and severe bilateral foraminal stenosis C5-6 and C6-7.  IMPRESSION: 1. No acute intracranial abnormality. 2. No acute abnormality of the cervical spine. Multilevel degenerative disc and joint disease with severe foraminal stenoses at multiple levels.   Electronically Signed   By: Francene Boyers M.D.   On: 02/04/2015 21:14   Ct Cervical Spine Wo Contrast  02/04/2015   CLINICAL DATA:  The patient fell and struck the right side of his head. Headache. Neck pain extending into the right shoulder.  EXAM: CT HEAD WITHOUT CONTRAST  CT CERVICAL SPINE WITHOUT CONTRAST  TECHNIQUE: Multidetector CT imaging of the head and cervical spine was performed following the standard protocol without intravenous contrast. Multiplanar CT image reconstructions of the cervical spine were also generated.  COMPARISON:  None.  FINDINGS: CT HEAD FINDINGS  No mass lesion. No midline shift. No acute hemorrhage or hematoma. No extra-axial fluid collections. No evidence of acute infarction. Brain parenchyma is normal. No osseous abnormality.  CT CERVICAL SPINE FINDINGS  There is no fracture or acute subluxation. There is multilevel degenerative disc and joint disease throughout the cervical spine. No prevertebral soft tissue swelling. There is fairly severe right foraminal stenosis at C3-4 and C4-5 and severe bilateral foraminal stenosis C5-6 and C6-7.  IMPRESSION: 1. No acute intracranial abnormality. 2. No acute abnormality of the cervical spine. Multilevel degenerative disc and joint disease with severe foraminal stenoses at multiple levels.   Electronically Signed   By: Francene Boyers M.D.   On: 02/04/2015 21:14   Ct Knee Left Wo Contrast  02/04/2015   CLINICAL DATA:  Pain after trip and fall today  EXAM: CT OF THE left KNEE WITHOUT CONTRAST  TECHNIQUE: Multidetector CT imaging of the  left knee was performed according to the standard protocol. Multiplanar CT image reconstructions were also generated.  COMPARISON:  Radiographs 02/04/2015  FINDINGS: There is no fracture. There is no dislocation. No acute soft tissue abnormality is evident, except for mild skin thickening and subcutaneous edema in the prepatellar region. There is no effusion.  IMPRESSION: Negative for acute fracture. Mild soft tissue swelling in the prepatellar region consistent with a superficial contusion.   Electronically Signed   By: Ellery Plunk M.D.   On: 02/04/2015 20:48   Dg Knee Complete 4 Views Left  02/04/2015   CLINICAL DATA:  Fall. Left knee injury and pain. Initial encounter.  EXAM: LEFT KNEE - COMPLETE 4+ VIEW  COMPARISON:  None.  FINDINGS: There is no evidence of fracture, dislocation, or joint effusion. There is no evidence of arthropathy or other focal bone abnormality. Mild prepatellar soft tissue swelling is noted.  IMPRESSION: Prepatellar soft tissue swelling. No evidence of fracture, dislocation, or knee joint effusion.   Electronically Signed   By: Myles Rosenthal M.D.   On: 02/04/2015 19:07   Dg Humerus Right  02/04/2015   CLINICAL DATA:  Fall, right shoulder pain  EXAM: RIGHT HUMERUS - 2+ VIEW  COMPARISON:  None.  FINDINGS: Right  humeral and glenoid bone anchors are identified. There is a mildly impacted fracture of the proximal right humerus with adjacent soft tissue swelling.  IMPRESSION: Mildly impacted fracture of the proximal right humerus involving the humeral head.   Electronically Signed   By: Christiana PellantGretchen  Green M.D.   On: 02/04/2015 19:07     Medical Consultants:    None.  Anti-Infectives:    None.  Subjective:   Danny Aurea GraffM Aumiller continues to feel dizzy and has some nausea.  Says she feels like the room is spinning.  Has a headache.    Objective:    Filed Vitals:   02/04/15 2230 02/04/15 2354 02/05/15 0500 02/05/15 0639  BP: 128/61 152/73 148/70   Pulse: 79 76 71   Temp:    97.9 F (36.6 C)   TempSrc:   Oral   Resp: 17     Weight:  93.9 kg (207 lb 0.2 oz)  93.9 kg (207 lb 0.2 oz)  SpO2: 91% 96% 95%    No intake or output data in the 24 hours ending 02/05/15 1033  Exam: Gen:  NAD Cardiovascular:  RRR, No M/R/G Respiratory:  Lungs CTAB Gastrointestinal:  Abdomen soft, NT/ND, + BS Extremities:  No C/E/C, RUE in sling   Data Reviewed:    Labs: Basic Metabolic Panel:  Recent Labs Lab 02/04/15 2030 02/05/15 0115  NA 142 138  K 3.8 3.8  CL 106 108  CO2  --  23  GLUCOSE 119* 106*  BUN 24* 18  CREATININE 0.90 0.87  CALCIUM  --  8.8*   GFR CrCl cannot be calculated (Unknown ideal weight.). Liver Function Tests:  Recent Labs Lab 02/05/15 0115  AST 77*  ALT 35  ALKPHOS 56  BILITOT 0.7  PROT 6.6  ALBUMIN 3.3*   CBC:  Recent Labs Lab 02/04/15 2030 02/05/15 0115  WBC  --  8.7  NEUTROABS  --  6.4  HGB 15.3* 14.1  HCT 45.0 43.1  MCV  --  94.7  PLT  --  218   Thyroid function studies:  Recent Labs  02/05/15 0115  TSH 1.389   Microbiology No results found for this or any previous visit (from the past 240 hour(s)).   Medications:   . heparin  5,000 Units Subcutaneous 3 times per day  . sodium chloride  3 mL Intravenous Q12H   Continuous Infusions:   Time spent: 25 minutes.   Eydie Wormley  [facility] Pager [phone]. If unable to reach me by pager, please call my cell phone at [phone].  *Please refer to [email or web address], password [email or web address] to get updated schedule on who will round on this patient, as hospitalists switch teams weekly. If 7PM-7AM, please contact night-coverage at [email or web address], password [email or web address] for any overnight needs.  02/05/2015, 10:33 AM

## 2015-02-05 NOTE — Progress Notes (Signed)
2nd RN skin assessment - Monyca Y.

## 2015-02-13 NOTE — Discharge Summary (Signed)
Physician Discharge Summary  Angela Leach YQM:[id number] DOB: March 13, 1933 DOA: 02/04/2015  PCP: Lupe Carney, MD  Admit date: 02/04/2015 Discharge date: 02/05/2015   Recommendations for Outpatient Follow-Up:   1. D/C'd home after vestibular PT evaluation.   Discharge Diagnosis:   Principal Problem:    Dizziness Active Problems:    Fracture of right humerus secondary to mechanical fall   Discharge disposition:  Home.    Discharge Condition: Improved.  Diet recommendation: Regular.   History of Present Illness:   Angela Leach is an 81 y.o. female with no significant PMH who was admitted 02/04/15 with a chief complaint of dizziness/lightheadedness after suffering from a mechanical fall. On admission, CT of the head was negative for acute abnormalities and she was found to have a fracture of the right humerus.   Hospital Course by Problem:   Principal Problem:  Dizziness, likely an adverse drug reaction to narcotic pain medications - CT of the head negative for acute findings. - Seen by PT/OT with recommendations for home health PT and 24 hour supervision. - Was monitored on telemetry with no arrhythmic events appreciated. - Not orthostatic. - Counseled on limiting pain medication use.  Active Problems:  Fracture of right humerus secondary to mechanical fall - Nonsurgical management recommended by Dr. Eulah Pont, orthopedic surgeon on call, after discussion with EDP. - Home health PT set up. - Continue sling RUE.  Medical Consultants:    None.   Discharge Exam:   Filed Vitals:   02/05/15 1408  BP: 143/56  Pulse: 71  Temp: 98.4 F (36.9 C)  Resp: 18   Filed Vitals:   02/04/15 2354 02/05/15 0500 02/05/15 0639 02/05/15 1408  BP: 152/73 148/70  143/56  Pulse: 76 71  71  Temp:  97.9 F (36.6 C)  98.4 F (36.9 C)  TempSrc:  Oral    Resp:    18  Weight: 93.9 kg (207 lb 0.2 oz)  93.9 kg (207 lb 0.2 oz)   SpO2: 96% 95%  94%    Gen:   NAD Cardiovascular:  RRR, No M/R/G Respiratory: Lungs CTAB Gastrointestinal: Abdomen soft, NT/ND with normal active bowel sounds. Extremities: No C/E/C, RUE in sling   The results of significant diagnostics from this hospitalization (including imaging, microbiology, ancillary and laboratory) are listed below for reference.     Procedures and Diagnostic Studies:   Dg Chest 2 View  02/04/2015   CLINICAL DATA:  Right shoulder pain after falling today  EXAM: CHEST  2 VIEW  COMPARISON:  05/21/2005  FINDINGS: The heart size and mediastinal contours are within normal limits. Both lungs are clear. The visualized skeletal structures are unremarkable. There is a proximal right humeral fracture.  IMPRESSION: No active cardiopulmonary disease.  Proximal right humeral fracture.   Electronically Signed   By: Ellery Plunk M.D.   On: 02/04/2015 19:08   Dg Shoulder Right  02/04/2015   CLINICAL DATA:  Right shoulder pain secondary to a fall today.  EXAM: RIGHT SHOULDER - 2+ VIEW  COMPARISON:  Humerus radiographs dated 02/04/2015  FINDINGS: There is a slightly impacted fracture of the right humeral neck with what appears to be avulsion of a portion of the greater tuberosity. No dislocation.  Surgical anchors from previous rotator cuff surgery.  IMPRESSION: Comminuted fracture of the proximal humerus.   Electronically Signed   By: Francene Boyers M.D.   On: 02/04/2015 19:08   Dg Elbow 2 Views Left  02/04/2015   CLINICAL DATA:  Fall,  left elbow pain  EXAM: LEFT ELBOW - 2 VIEW  COMPARISON:  Forearm radiographs same date  FINDINGS: There is no evidence of fracture, dislocation, or joint effusion. There is no evidence of arthropathy or other focal bone abnormality. Soft tissues are unremarkable.  IMPRESSION: Negative.   Electronically Signed   By: Christiana PellantGretchen  Green M.D.   On: 02/04/2015 19:08   Dg Forearm Left  02/04/2015   CLINICAL DATA:  Pain after falling today  EXAM: LEFT FOREARM - 2 VIEW  COMPARISON:  None.   FINDINGS: There is no evidence of fracture or other focal bone lesions. Soft tissues are unremarkable.  IMPRESSION: Negative.   Electronically Signed   By: Ellery Plunkaniel R Mitchell M.D.   On: 02/04/2015 19:07   Ct Head Wo Contrast  02/04/2015   CLINICAL DATA:  The patient fell and struck the right side of his head. Headache. Neck pain extending into the right shoulder.  EXAM: CT HEAD WITHOUT CONTRAST  CT CERVICAL SPINE WITHOUT CONTRAST  TECHNIQUE: Multidetector CT imaging of the head and cervical spine was performed following the standard protocol without intravenous contrast. Multiplanar CT image reconstructions of the cervical spine were also generated.  COMPARISON:  None.  FINDINGS: CT HEAD FINDINGS  No mass lesion. No midline shift. No acute hemorrhage or hematoma. No extra-axial fluid collections. No evidence of acute infarction. Brain parenchyma is normal. No osseous abnormality.  CT CERVICAL SPINE FINDINGS  There is no fracture or acute subluxation. There is multilevel degenerative disc and joint disease throughout the cervical spine. No prevertebral soft tissue swelling. There is fairly severe right foraminal stenosis at C3-4 and C4-5 and severe bilateral foraminal stenosis C5-6 and C6-7.  IMPRESSION: 1. No acute intracranial abnormality. 2. No acute abnormality of the cervical spine. Multilevel degenerative disc and joint disease with severe foraminal stenoses at multiple levels.   Electronically Signed   By: Francene BoyersJames  Maxwell M.D.   On: 02/04/2015 21:14   Ct Cervical Spine Wo Contrast  02/04/2015   CLINICAL DATA:  The patient fell and struck the right side of his head. Headache. Neck pain extending into the right shoulder.  EXAM: CT HEAD WITHOUT CONTRAST  CT CERVICAL SPINE WITHOUT CONTRAST  TECHNIQUE: Multidetector CT imaging of the head and cervical spine was performed following the standard protocol without intravenous contrast. Multiplanar CT image reconstructions of the cervical spine were also generated.   COMPARISON:  None.  FINDINGS: CT HEAD FINDINGS  No mass lesion. No midline shift. No acute hemorrhage or hematoma. No extra-axial fluid collections. No evidence of acute infarction. Brain parenchyma is normal. No osseous abnormality.  CT CERVICAL SPINE FINDINGS  There is no fracture or acute subluxation. There is multilevel degenerative disc and joint disease throughout the cervical spine. No prevertebral soft tissue swelling. There is fairly severe right foraminal stenosis at C3-4 and C4-5 and severe bilateral foraminal stenosis C5-6 and C6-7.  IMPRESSION: 1. No acute intracranial abnormality. 2. No acute abnormality of the cervical spine. Multilevel degenerative disc and joint disease with severe foraminal stenoses at multiple levels.   Electronically Signed   By: Francene BoyersJames  Maxwell M.D.   On: 02/04/2015 21:14   Ct Knee Left Wo Contrast  02/04/2015   CLINICAL DATA:  Pain after trip and fall today  EXAM: CT OF THE left KNEE WITHOUT CONTRAST  TECHNIQUE: Multidetector CT imaging of the left knee was performed according to the standard protocol. Multiplanar CT image reconstructions were also generated.  COMPARISON:  Radiographs 02/04/2015  FINDINGS: There is  no fracture. There is no dislocation. No acute soft tissue abnormality is evident, except for mild skin thickening and subcutaneous edema in the prepatellar region. There is no effusion.  IMPRESSION: Negative for acute fracture. Mild soft tissue swelling in the prepatellar region consistent with a superficial contusion.   Electronically Signed   By: Ellery Plunk M.D.   On: 02/04/2015 20:48   Dg Knee Complete 4 Views Left  02/04/2015   CLINICAL DATA:  Fall. Left knee injury and pain. Initial encounter.  EXAM: LEFT KNEE - COMPLETE 4+ VIEW  COMPARISON:  None.  FINDINGS: There is no evidence of fracture, dislocation, or joint effusion. There is no evidence of arthropathy or other focal bone abnormality. Mild prepatellar soft tissue swelling is noted.  IMPRESSION:  Prepatellar soft tissue swelling. No evidence of fracture, dislocation, or knee joint effusion.   Electronically Signed   By: Myles Rosenthal M.D.   On: 02/04/2015 19:07   Dg Humerus Right  02/04/2015   CLINICAL DATA:  Fall, right shoulder pain  EXAM: RIGHT HUMERUS - 2+ VIEW  COMPARISON:  None.  FINDINGS: Right humeral and glenoid bone anchors are identified. There is a mildly impacted fracture of the proximal right humerus with adjacent soft tissue swelling.  IMPRESSION: Mildly impacted fracture of the proximal right humerus involving the humeral head.   Electronically Signed   By: Christiana Pellant M.D.   On: 02/04/2015 19:07     Labs:   Labs: Basic Metabolic Panel:  Recent Labs Lab 02/04/15 2030 02/05/15 0115  NA 142 138  K 3.8 3.8  CL 106 108  CO2  --  23  GLUCOSE 119* 106*  BUN 24* 18  CREATININE 0.90 0.87  CALCIUM  --  8.8*   GFR CrCl cannot be calculated (Unknown ideal weight.). Liver Function Tests:  Recent Labs Lab 02/05/15 0115  AST 77*  ALT 35  ALKPHOS 56  BILITOT 0.7  PROT 6.6  ALBUMIN 3.3*   CBC:  Recent Labs Lab 02/04/15 2030 02/05/15 0115  WBC  --  8.7  NEUTROABS  --  6.4  HGB 15.3* 14.1  HCT 45.0 43.1  MCV  --  94.7  PLT  --  218   Thyroid function studies:  Recent Labs  02/05/15 0115  TSH 1.389   Microbiology No results found for this or any previous visit (from the past 240 hour(s)).   Discharge Instructions:   Discharge Instructions    Call MD for:  extreme fatigue    Complete by:  As directed      Call MD for:  persistant dizziness or light-headedness    Complete by:  As directed      Call MD for:  persistant nausea and vomiting    Complete by:  As directed      Call MD for:  severe uncontrolled pain    Complete by:  As directed      Call MD for:  temperature >100.4    Complete by:  As directed      Diet general    Complete by:  As directed      Discharge instructions    Complete by:  As directed   You were cared for by Dr.  Hillery Aldo  (a hospitalist) during your hospital stay. If you have any questions about your discharge medications or the care you received while you were in the hospital after you are discharged, you can call the unit and ask to speak with the hospitalist  on call if the hospitalist that took care of you is not available. Once you are discharged, your primary care physician will handle any further medical issues. Please note that NO REFILLS for any discharge medications will be authorized once you are discharged, as it is imperative that you return to your primary care physician (or establish a relationship with a primary care physician if you do not have one) for your aftercare needs so that they can reassess your need for medications and monitor your lab values.  Any outstanding tests can be reviewed by your PCP at your follow up visit.  It is also important to review any medicine changes with your PCP.  Please bring these d/c instructions with you to your next visit so your physician can review these changes with you.  If you do not have a primary care physician, you can call [phone] for a physician referral.  It is highly recommended that you obtain a PCP for hospital follow up.     Increase activity slowly    Complete by:  As directed             Medication List    TAKE these medications        HYDROcodone-acetaminophen 5-325 MG per tablet  Commonly known as:  NORCO/VICODIN  Take 1-2 tablets by mouth every 4 (four) hours as needed for moderate pain.     ondansetron 4 MG tablet  Commonly known as:  ZOFRAN  Take 1 tablet (4 mg total) by mouth every 6 (six) hours as needed for nausea.     senna-docusate 8.6-50 MG per tablet  Commonly known as:  Senokot-S  Take 1 tablet by mouth at bedtime as needed for mild constipation.           Follow-up Information    Follow up with Lupe Carneyean Mitchell, MD In 1 week.   Specialty:  Family Medicine   Why:  will call patient to make appointment    Contact information:   [address] [place] [address] [phone]        Time coordinating discharge: 25 minutes.  Signed:  RAMA,CHRISTINA  Pager [phone] [facility] 02/13/2015, 6:34 PM

## 2016-01-17 ENCOUNTER — Other Ambulatory Visit: Payer: Self-pay | Admitting: Orthopaedic Surgery

## 2016-01-17 DIAGNOSIS — M542 Cervicalgia: Secondary | ICD-10-CM

## 2016-01-23 ENCOUNTER — Other Ambulatory Visit: Payer: Self-pay | Admitting: Orthopedic Surgery

## 2016-01-23 ENCOUNTER — Other Ambulatory Visit: Payer: Self-pay | Admitting: Orthopaedic Surgery

## 2016-01-23 DIAGNOSIS — M75100 Unspecified rotator cuff tear or rupture of unspecified shoulder, not specified as traumatic: Secondary | ICD-10-CM

## 2016-01-23 DIAGNOSIS — M75101 Unspecified rotator cuff tear or rupture of right shoulder, not specified as traumatic: Secondary | ICD-10-CM

## 2016-01-24 ENCOUNTER — Other Ambulatory Visit: Payer: PPO

## 2016-02-01 ENCOUNTER — Ambulatory Visit
Admission: RE | Admit: 2016-02-01 | Discharge: 2016-02-01 | Disposition: A | Discharge status: Home / Self Care | Payer: PPO | Source: Ambulatory Visit | Attending: Orthopaedic Surgery | Admitting: Orthopaedic Surgery

## 2016-02-01 ENCOUNTER — Ambulatory Visit
Admission: RE | Admit: 2016-02-01 | Discharge: 2016-02-01 | Disposition: A | Discharge status: Home / Self Care | Payer: PPO | Source: Ambulatory Visit | Attending: Orthopedic Surgery | Admitting: Orthopedic Surgery

## 2016-02-01 DIAGNOSIS — M542 Cervicalgia: Secondary | ICD-10-CM

## 2016-02-01 DIAGNOSIS — M75100 Unspecified rotator cuff tear or rupture of unspecified shoulder, not specified as traumatic: Secondary | ICD-10-CM

## 2017-10-27 DIAGNOSIS — R197 Diarrhea, unspecified: Secondary | ICD-10-CM | POA: Diagnosis not present

## 2019-01-24 DIAGNOSIS — S30861A Insect bite (nonvenomous) of abdominal wall, initial encounter: Secondary | ICD-10-CM | POA: Diagnosis not present

## 2019-11-21 ENCOUNTER — Emergency Department (HOSPITAL_COMMUNITY): Payer: Medicare HMO

## 2019-11-21 ENCOUNTER — Other Ambulatory Visit: Payer: Self-pay

## 2019-11-21 ENCOUNTER — Inpatient Hospital Stay (HOSPITAL_COMMUNITY)
Admission: EM | Admit: 2019-11-21 | Discharge: 2019-11-23 | DRG: 309 | Disposition: A | Discharge status: Home Health | Payer: Medicare HMO | Attending: Family Medicine | Admitting: Family Medicine

## 2019-11-21 ENCOUNTER — Encounter (HOSPITAL_COMMUNITY): Payer: Self-pay | Admitting: Family Medicine

## 2019-11-21 DIAGNOSIS — Z20822 Contact with and (suspected) exposure to covid-19: Secondary | ICD-10-CM | POA: Diagnosis present

## 2019-11-21 DIAGNOSIS — S299XXA Unspecified injury of thorax, initial encounter: Secondary | ICD-10-CM | POA: Diagnosis not present

## 2019-11-21 DIAGNOSIS — N289 Disorder of kidney and ureter, unspecified: Secondary | ICD-10-CM

## 2019-11-21 DIAGNOSIS — M79641 Pain in right hand: Secondary | ICD-10-CM | POA: Diagnosis not present

## 2019-11-21 DIAGNOSIS — Y92009 Unspecified place in unspecified non-institutional (private) residence as the place of occurrence of the external cause: Secondary | ICD-10-CM | POA: Diagnosis not present

## 2019-11-21 DIAGNOSIS — S0993XA Unspecified injury of face, initial encounter: Secondary | ICD-10-CM | POA: Diagnosis not present

## 2019-11-21 DIAGNOSIS — Z9181 History of falling: Secondary | ICD-10-CM

## 2019-11-21 DIAGNOSIS — S0990XA Unspecified injury of head, initial encounter: Secondary | ICD-10-CM | POA: Diagnosis not present

## 2019-11-21 DIAGNOSIS — S0083XA Contusion of other part of head, initial encounter: Secondary | ICD-10-CM | POA: Diagnosis not present

## 2019-11-21 DIAGNOSIS — I471 Supraventricular tachycardia: Secondary | ICD-10-CM | POA: Diagnosis not present

## 2019-11-21 DIAGNOSIS — R079 Chest pain, unspecified: Secondary | ICD-10-CM | POA: Diagnosis not present

## 2019-11-21 DIAGNOSIS — M25511 Pain in right shoulder: Secondary | ICD-10-CM | POA: Diagnosis not present

## 2019-11-21 DIAGNOSIS — S0181XA Laceration without foreign body of other part of head, initial encounter: Secondary | ICD-10-CM | POA: Diagnosis present

## 2019-11-21 DIAGNOSIS — S149XXA Injury of unspecified nerves of neck, initial encounter: Secondary | ICD-10-CM | POA: Diagnosis not present

## 2019-11-21 DIAGNOSIS — Y92008 Other place in unspecified non-institutional (private) residence as the place of occurrence of the external cause: Secondary | ICD-10-CM

## 2019-11-21 DIAGNOSIS — N179 Acute kidney failure, unspecified: Secondary | ICD-10-CM | POA: Diagnosis present

## 2019-11-21 DIAGNOSIS — J329 Chronic sinusitis, unspecified: Secondary | ICD-10-CM | POA: Insufficient documentation

## 2019-11-21 DIAGNOSIS — S8992XA Unspecified injury of left lower leg, initial encounter: Secondary | ICD-10-CM | POA: Diagnosis not present

## 2019-11-21 DIAGNOSIS — I4891 Unspecified atrial fibrillation: Secondary | ICD-10-CM | POA: Diagnosis not present

## 2019-11-21 DIAGNOSIS — M79642 Pain in left hand: Secondary | ICD-10-CM | POA: Diagnosis not present

## 2019-11-21 DIAGNOSIS — S79912A Unspecified injury of left hip, initial encounter: Secondary | ICD-10-CM | POA: Diagnosis not present

## 2019-11-21 DIAGNOSIS — W108XXA Fall (on) (from) other stairs and steps, initial encounter: Secondary | ICD-10-CM | POA: Diagnosis present

## 2019-11-21 DIAGNOSIS — S199XXA Unspecified injury of neck, initial encounter: Secondary | ICD-10-CM | POA: Diagnosis not present

## 2019-11-21 DIAGNOSIS — Z79891 Long term (current) use of opiate analgesic: Secondary | ICD-10-CM | POA: Diagnosis not present

## 2019-11-21 DIAGNOSIS — S6992XA Unspecified injury of left wrist, hand and finger(s), initial encounter: Secondary | ICD-10-CM | POA: Diagnosis not present

## 2019-11-21 DIAGNOSIS — W19XXXA Unspecified fall, initial encounter: Secondary | ICD-10-CM

## 2019-11-21 DIAGNOSIS — Z880 Allergy status to penicillin: Secondary | ICD-10-CM

## 2019-11-21 DIAGNOSIS — R110 Nausea: Secondary | ICD-10-CM | POA: Diagnosis not present

## 2019-11-21 DIAGNOSIS — M25539 Pain in unspecified wrist: Secondary | ICD-10-CM | POA: Diagnosis not present

## 2019-11-21 DIAGNOSIS — Z79899 Other long term (current) drug therapy: Secondary | ICD-10-CM

## 2019-11-21 DIAGNOSIS — M25552 Pain in left hip: Secondary | ICD-10-CM | POA: Diagnosis not present

## 2019-11-21 DIAGNOSIS — R531 Weakness: Secondary | ICD-10-CM | POA: Diagnosis not present

## 2019-11-21 DIAGNOSIS — M25561 Pain in right knee: Secondary | ICD-10-CM | POA: Diagnosis not present

## 2019-11-21 DIAGNOSIS — S4991XA Unspecified injury of right shoulder and upper arm, initial encounter: Secondary | ICD-10-CM | POA: Diagnosis not present

## 2019-11-21 DIAGNOSIS — S79911A Unspecified injury of right hip, initial encounter: Secondary | ICD-10-CM | POA: Diagnosis not present

## 2019-11-21 DIAGNOSIS — R52 Pain, unspecified: Secondary | ICD-10-CM | POA: Diagnosis not present

## 2019-11-21 DIAGNOSIS — M25559 Pain in unspecified hip: Secondary | ICD-10-CM | POA: Diagnosis not present

## 2019-11-21 DIAGNOSIS — S42301D Unspecified fracture of shaft of humerus, right arm, subsequent encounter for fracture with routine healing: Secondary | ICD-10-CM | POA: Diagnosis not present

## 2019-11-21 DIAGNOSIS — S8991XA Unspecified injury of right lower leg, initial encounter: Secondary | ICD-10-CM | POA: Diagnosis not present

## 2019-11-21 DIAGNOSIS — M25562 Pain in left knee: Secondary | ICD-10-CM | POA: Diagnosis not present

## 2019-11-21 DIAGNOSIS — M25551 Pain in right hip: Secondary | ICD-10-CM | POA: Diagnosis not present

## 2019-11-21 DIAGNOSIS — R519 Headache, unspecified: Secondary | ICD-10-CM | POA: Diagnosis not present

## 2019-11-21 LAB — CBC WITH DIFFERENTIAL/PLATELET
Abs Immature Granulocytes: 0.02 10*3/uL (ref 0.00–0.07)
Basophils Absolute: 0 10*3/uL (ref 0.0–0.1)
Basophils Relative: 0 %
Eosinophils Absolute: 0.1 10*3/uL (ref 0.0–0.5)
Eosinophils Relative: 2 %
HCT: 48.2 % — ABNORMAL HIGH (ref 36.0–46.0)
Hemoglobin: 15.6 g/dL — ABNORMAL HIGH (ref 12.0–15.0)
Immature Granulocytes: 0 %
Lymphocytes Relative: 27 %
Lymphs Abs: 2 10*3/uL (ref 0.7–4.0)
MCH: 31.6 pg (ref 26.0–34.0)
MCHC: 32.4 g/dL (ref 30.0–36.0)
MCV: 97.6 fL (ref 80.0–100.0)
Monocytes Absolute: 0.9 10*3/uL (ref 0.1–1.0)
Monocytes Relative: 12 %
Neutro Abs: 4.3 10*3/uL (ref 1.7–7.7)
Neutrophils Relative %: 59 %
Platelets: 172 10*3/uL (ref 150–400)
RBC: 4.94 MIL/uL (ref 3.87–5.11)
RDW: 12.8 % (ref 11.5–15.5)
WBC: 7.4 10*3/uL (ref 4.0–10.5)
nRBC: 0 % (ref 0.0–0.2)

## 2019-11-21 LAB — PROTIME-INR
INR: 1 (ref 0.8–1.2)
Prothrombin Time: 12.6 seconds (ref 11.4–15.2)

## 2019-11-21 LAB — BASIC METABOLIC PANEL
Anion gap: 11 (ref 5–15)
BUN: 28 mg/dL — ABNORMAL HIGH (ref 8–23)
CO2: 26 mmol/L (ref 22–32)
Calcium: 9.2 mg/dL (ref 8.9–10.3)
Chloride: 105 mmol/L (ref 98–111)
Creatinine, Ser: 1.03 mg/dL — ABNORMAL HIGH (ref 0.44–1.00)
GFR calc Af Amer: 58 mL/min — ABNORMAL LOW (ref >60)
GFR calc non Af Amer: 50 mL/min — ABNORMAL LOW (ref >60)
Glucose, Bld: 91 mg/dL (ref 70–99)
Potassium: 3.8 mmol/L (ref 3.5–5.1)
Sodium: 142 mmol/L (ref 135–145)

## 2019-11-21 MED ORDER — MORPHINE SULFATE (PF) 4 MG/ML IV SOLN: 4.0000 mg | Freq: Once | INTRAVENOUS | Status: DC

## 2019-11-21 MED ORDER — ACETAMINOPHEN 325 MG PO TABS: 650.0000 mg | ORAL_TABLET | ORAL | Status: DC | PRN

## 2019-11-21 MED ORDER — ACETAMINOPHEN 325 MG PO TABS
650.0000 mg | ORAL_TABLET | Freq: Once | ORAL | Status: AC
  Administered 2019-11-21: 650 mg via ORAL
  Filled 2019-11-21: qty 2

## 2019-11-21 MED ORDER — SODIUM CHLORIDE 0.9 % IV SOLN
1000.0000 mL | INTRAVENOUS | Status: DC
  Administered 2019-11-21: 1000 mL via INTRAVENOUS

## 2019-11-21 MED ORDER — APIXABAN 5 MG PO TABS
5.0000 mg | ORAL_TABLET | Freq: Two times a day (BID) | ORAL | Status: DC
  Administered 2019-11-21 – 2019-11-23 (×4): 5 mg via ORAL
  Filled 2019-11-21 (×4): qty 1

## 2019-11-21 MED ORDER — POTASSIUM CHLORIDE IN NACL 20-0.9 MEQ/L-% IV SOLN
INTRAVENOUS | Status: DC
  Filled 2019-11-21: qty 1000

## 2019-11-21 MED ORDER — HYDROCODONE-ACETAMINOPHEN 5-325 MG PO TABS
1.0000 | ORAL_TABLET | Freq: Four times a day (QID) | ORAL | Status: DC | PRN
  Administered 2019-11-21: 2 via ORAL
  Filled 2019-11-21: qty 2

## 2019-11-21 MED ORDER — ONDANSETRON HCL 4 MG/2ML IJ SOLN: 4.0000 mg | Freq: Four times a day (QID) | INTRAMUSCULAR | Status: DC | PRN

## 2019-11-21 MED ORDER — SODIUM CHLORIDE 0.9 % IV BOLUS (SEPSIS)
500.0000 mL | Freq: Once | INTRAVENOUS | Status: AC
  Administered 2019-11-21: 500 mL via INTRAVENOUS

## 2019-11-21 NOTE — ED Notes (Signed)
Dayghter, Marden Noble, left her phone number [phone].

## 2019-11-21 NOTE — H&P (Signed)
History and Physical    Angela Leach FAO:[id number] DOB: 08-Feb-1933 DOA: 11/21/2019  PCP: Clovis Riley, L.August Saucer, MD   Patient coming from: Home   Chief Complaint: fall, pain in hands, right shoulder, and face  HPI: Angela HILDEGARD HLAVAC is a 86 y.o. female who describes herself as generally healthy, denying any chronic medical problems, and now presenting to the emergency department for evaluation of pain in her hands, face, and right shoulder after a fall.  Patient reports that she was going down some steps, tripped on the second to last step, and fell forward onto her hands but also hit her face.  She does not believe that she lost consciousness and pain was tolerable initially, but throughout the course of the day, she had increasing pain and swelling in her hands and face, also experiencing pain in the right shoulder, and decided to come in for evaluation.  She denies any lightheadedness or chest pain, or palpitations preceding the fall and believes that she stepped too far forward and missed the second to last step.  She denies frequent falls but notes that she did fall about 1 year ago.  She denies any recent fevers, chills, cough, or shortness of breath.  She does note occasional palpitations for the past few months, mainly at night when she is laying in bed, and described as heart racing.  She reports that these episodes are self-limited and seem to resolve after several minutes.  ED Course: Upon arrival to the ED, patient is found to be afebrile, saturating well on room air, tachycardic up to the 130s, and with stable blood pressure.  EKG features atrial fibrillation with RVR, rate 121, PVCs, and nonspecific ST-T abnormality.  Radiographs of the hip, hands, right shoulder, knees, and right scapula are negative for definite acute osseous abnormalities and chest x-ray is also negative for acute findings.  CT head is negative for acute intracranial abnormality, no acute fracture noted on cervical spine CT,  and maxillofacial CT is notable for sinus disease.  Patient was given IV fluids and analgesia in the emergency department, COVID-19 screening test has not yet resulted, and hospitalists are consulted for admission.  Review of Systems:  All other systems reviewed and apart from HPI, are negative.  History reviewed. No pertinent past medical history.  History reviewed. No pertinent surgical history.   reports that she has never smoked. She has never used smokeless tobacco. No history on file for alcohol and drug.  Allergies  Allergen Reactions  . Penicillins     unknown    History reviewed. No pertinent family history.   Prior to Admission medications   Medication Sig Start Date End Date Taking? Authorizing Provider  HYDROcodone-acetaminophen (NORCO/VICODIN) 5-325 MG per tablet Take 1-2 tablets by mouth every 4 (four) hours as needed for moderate pain. 02/05/15   Rama, Maryruth Bun, MD  ondansetron (ZOFRAN) 4 MG tablet Take 1 tablet (4 mg total) by mouth every 6 (six) hours as needed for nausea. 02/05/15   Rama, Maryruth Bun, MD  senna-docusate (SENOKOT-S) 8.6-50 MG per tablet Take 1 tablet by mouth at bedtime as needed for mild constipation. 02/05/15   Rama, Maryruth Bun, MD    Physical Exam: Vitals:   11/21/19 1500 11/21/19 1506 11/21/19 1525 11/21/19 2053  BP:   119/62 124/66  Pulse: (!) 136  (!) 109 67  Resp:   18 18  Temp: 98.9 F (37.2 C)   98.8 F (37.1 C)  TempSrc: Oral   Oral  SpO2:  98% 98%  Weight:  90.7 kg  89.8 kg  Height:  5\' 6"  (1.676 m)  5\' 5"  (1.651 m)     Constitutional: NAD, calm  Eyes: PERTLA, lids and conjunctivae normal ENMT: Mucous membranes are moist. Posterior pharynx clear of any exudate or lesions.   Neck: normal, supple, no masses, no thyromegaly Respiratory: clear to auscultation bilaterally, no wheezing, no crackles. No accessory muscle use.  Cardiovascular: Rate ~120 and irregularly irregular. No extremity edema. Abdomen: No distension, no  tenderness, soft. Bowel sounds active.  Musculoskeletal: no clubbing / cyanosis. No joint deformity upper and lower extremities.   Skin: superficial abrasions, edema, and ecchymoses involving hands and forehead. Warm, dry, well-perfused. Neurologic: CN 2-12 grossly intact. Sensation intact. Strength 5/5 in all 4 limbs.  Psychiatric: Alert and oriented to person, place, and situation. Pleasant and cooperative.    Labs and Imaging on Admission: I have personally reviewed following labs and imaging studies  CBC: Recent Labs  Lab 11/21/19 1547  WBC 7.4  NEUTROABS 4.3  HGB 15.6*  HCT 48.2*  MCV 97.6  PLT 841   Basic Metabolic Panel: Recent Labs  Lab 11/21/19 1547  NA 142  K 3.8  CL 105  CO2 26  GLUCOSE 91  BUN 28*  CREATININE 1.03*  CALCIUM 9.2   GFR: Estimated Creatinine Clearance: 45 mL/min (A) (by C-G formula based on SCr of 1.03 mg/dL (H)). Liver Function Tests: No results for input(s): AST, ALT, ALKPHOS, BILITOT, PROT, ALBUMIN in the last 168 hours. No results for input(s): LIPASE, AMYLASE in the last 168 hours. No results for input(s): AMMONIA in the last 168 hours. Coagulation Profile: Recent Labs  Lab 11/21/19 1547  INR 1.0   Cardiac Enzymes: No results for input(s): CKTOTAL, CKMB, CKMBINDEX, TROPONINI in the last 168 hours. BNP (last 3 results) No results for input(s): PROBNP in the last 8760 hours. HbA1C: No results for input(s): HGBA1C in the last 72 hours. CBG: No results for input(s): GLUCAP in the last 168 hours. Lipid Profile: No results for input(s): CHOL, HDL, LDLCALC, TRIG, CHOLHDL, LDLDIRECT in the last 72 hours. Thyroid Function Tests: No results for input(s): TSH, T4TOTAL, FREET4, T3FREE, THYROIDAB in the last 72 hours. Anemia Panel: No results for input(s): VITAMINB12, FOLATE, FERRITIN, TIBC, IRON, RETICCTPCT in the last 72 hours. Urine analysis:    Component Value Date/Time   COLORURINE YELLOW 02/05/2015 0510   APPEARANCEUR CLEAR  02/05/2015 0510   LABSPEC 1.007 02/05/2015 0510   PHURINE 6.0 02/05/2015 0510   GLUCOSEU NEGATIVE 02/05/2015 0510   HGBUR NEGATIVE 02/05/2015 0510   BILIRUBINUR NEGATIVE 02/05/2015 0510   KETONESUR NEGATIVE 02/05/2015 0510   PROTEINUR NEGATIVE 02/05/2015 0510   UROBILINOGEN 0.2 02/05/2015 0510   NITRITE NEGATIVE 02/05/2015 0510   LEUKOCYTESUR NEGATIVE 02/05/2015 0510   Sepsis Labs: @LABRCNTIP (procalcitonin:4,lacticidven:4) )No results found for this or any previous visit (from the past 240 hour(s)).   Radiological Exams on Admission: DG Chest 2 View  Result Date: 11/21/2019 CLINICAL DATA:  Fall, pain EXAM: CHEST - 2 VIEW COMPARISON:  Feb 04, 2015 FINDINGS: The heart size and mediastinal contours are within normal limits. Aortic knob calcifications. Both lungs are clear. The visualized skeletal structures are unremarkable. IMPRESSION: No active cardiopulmonary disease. Electronically Signed   By: Prudencio Pair M.D.   On: 11/21/2019 16:55   DG Scapula Right  Result Date: 11/21/2019 CLINICAL DATA:  Fall, pain EXAM: RIGHT SCAPULA - 2+ VIEWS COMPARISON:  Feb 04, 2016 FINDINGS: There is no evidence of fracture  or other focal bone lesions. Healed fracture deformity of the proximal humerus. Soft tissues are unremarkable. IMPRESSION: Negative. Electronically Signed   By: Jonna Clark M.D.   On: 11/21/2019 16:59   DG Shoulder Right  Result Date: 11/21/2019 CLINICAL DATA:  Fall EXAM: RIGHT SHOULDER - 2+ VIEW COMPARISON:  Feb 04, 2015 FINDINGS: There is a healed fracture deformity of the right humerus. Overlying surgical anchors. Well corticated ossicle overlying the lateral humeral head. No acute fracture or dislocation. AC joint arthrosis. IMPRESSION: No acute osseous abnormality. Electronically Signed   By: Jonna Clark M.D.   On: 11/21/2019 16:58   CT Head Wo Contrast  Result Date: 11/21/2019 CLINICAL DATA:  Head trauma, minor. Facial trauma. Poly trauma, critical, head/cervical spine injury  suspected. Additional history provided: 86 year old female via EMS from home status post trip and fall yesterday at 5 p.m. while walking down outdoor steps, abrasions to face and left hand from hitting concrete EXAM: CT HEAD WITHOUT CONTRAST CT MAXILLOFACIAL WITHOUT CONTRAST CT CERVICAL SPINE WITHOUT CONTRAST TECHNIQUE: Contiguous axial images were obtained from the base of the skull through the vertex without intravenous contrast. Multidetector CT imaging of the maxillofacial structures was performed. Multiplanar CT image reconstructions were also generated. A small metallic BB was placed on the right temple in order to reliably differentiate right from left. Multidetector CT imaging of the cervical spine was performed without intravenous contrast. Multiplanar CT image reconstructions were also generated. COMPARISON:  CT head/cervical spine 02/04/2015 FINDINGS: CT HEAD FINDINGS Brain: No evidence of acute intracranial hemorrhage. No demarcated cortical infarction. No evidence of intracranial mass. No midline shift or extra-axial fluid collection. Stable, mild generalized parenchymal atrophy. Vascular: No hyperdense vessel. Skull: Normal. Negative for fracture or focal lesion. CT MAXILLOFACIAL FINDINGS Osseous: No evidence of acute maxillofacial fracture. Orbits: No acute abnormality. Sinuses: Moderate/severe mucosal thickening within the right maxillary sinus. Associated hyperdensity within the right maxillary sinus, which may reflect inspissated secretions or sequela of chronic fungal sinusitis. There is a small defect within the floor of the right maxillary sinus (series 9, image 21). There is also moderate mucosal thickening within the inferior left maxillary sinus. There is prominent periapical lucency surrounding the adjacent posterior left maxillary molar with cortical breakthrough (series 10, image 46) and the possibility of odontogenic sinusitis is raised. Additional mild mucosal thickening within the  bilateral frontal and ethmoid air cells. Soft tissues: Mild soft tissue swelling along the forehead. CT CERVICAL SPINE FINDINGS Alignment: Straightening of the expected cervical lordosis. Trace C4-C5 and C5-C6 anterolisthesis. Skull base and vertebrae: The basion-dental and atlanto-dental intervals are maintained.No evidence of acute fracture to the cervical spine. Soft tissues and spinal canal: No prevertebral fluid or swelling. No visible canal hematoma. Calcified plaque within the carotid arteries. Disc levels: Cervical spondylosis with multilevel posterior disc osteophytes, uncovertebral and facet hypertrophy. No high-grade bony spinal canal stenosis. Multilevel neural foraminal narrowing greatest on the right at C3-C4, bilaterally at C5-C6 and bilaterally at C6-C7. Upper chest: No consolidation within the imaged lung apices. No visible pneumothorax. IMPRESSION: CT head: 1. No evidence of acute intracranial abnormality. 2. Stable, mild generalized parenchymal atrophy. CT maxillofacial: 1. No evidence of acute maxillofacial fracture. 2. Mild forehead soft tissue swelling. 3. Paranasal sinus disease as described. Most notably, there is more moderate/severe mucosal thickening within the right maxillary sinus. There is also hyperdensity within the right maxillary sinus which may reflect inspissated secretions or sequela of chronic fungal sinusitis. A small defect within the floor of the right maxillary  may reflect an oro-antral fistula. Also of note, adjacent to the mucosal thickening within the inferior left maxillary sinus there is prominent periapical lucency surrounding the posterior left maxillary molar with cortical breakthrough. This raises the possibility of odontogenic sinusitis. CT cervical spine: 1. No evidence of acute fracture to the cervical spine. 2. Cervical spondylosis as described. Electronically Signed   By: Jackey LogeKyle  Golden DO   On: 11/21/2019 17:58   CT Cervical Spine Wo Contrast  Result Date:  11/21/2019 CLINICAL DATA:  Head trauma, minor. Facial trauma. Poly trauma, critical, head/cervical spine injury suspected. Additional history provided: 86 year old female via EMS from home status post trip and fall yesterday at 5 p.m. while walking down outdoor steps, abrasions to face and left hand from hitting concrete EXAM: CT HEAD WITHOUT CONTRAST CT MAXILLOFACIAL WITHOUT CONTRAST CT CERVICAL SPINE WITHOUT CONTRAST TECHNIQUE: Contiguous axial images were obtained from the base of the skull through the vertex without intravenous contrast. Multidetector CT imaging of the maxillofacial structures was performed. Multiplanar CT image reconstructions were also generated. A small metallic BB was placed on the right temple in order to reliably differentiate right from left. Multidetector CT imaging of the cervical spine was performed without intravenous contrast. Multiplanar CT image reconstructions were also generated. COMPARISON:  CT head/cervical spine 02/04/2015 FINDINGS: CT HEAD FINDINGS Brain: No evidence of acute intracranial hemorrhage. No demarcated cortical infarction. No evidence of intracranial mass. No midline shift or extra-axial fluid collection. Stable, mild generalized parenchymal atrophy. Vascular: No hyperdense vessel. Skull: Normal. Negative for fracture or focal lesion. CT MAXILLOFACIAL FINDINGS Osseous: No evidence of acute maxillofacial fracture. Orbits: No acute abnormality. Sinuses: Moderate/severe mucosal thickening within the right maxillary sinus. Associated hyperdensity within the right maxillary sinus, which may reflect inspissated secretions or sequela of chronic fungal sinusitis. There is a small defect within the floor of the right maxillary sinus (series 9, image 21). There is also moderate mucosal thickening within the inferior left maxillary sinus. There is prominent periapical lucency surrounding the adjacent posterior left maxillary molar with cortical breakthrough (series 10, image  46) and the possibility of odontogenic sinusitis is raised. Additional mild mucosal thickening within the bilateral frontal and ethmoid air cells. Soft tissues: Mild soft tissue swelling along the forehead. CT CERVICAL SPINE FINDINGS Alignment: Straightening of the expected cervical lordosis. Trace C4-C5 and C5-C6 anterolisthesis. Skull base and vertebrae: The basion-dental and atlanto-dental intervals are maintained.No evidence of acute fracture to the cervical spine. Soft tissues and spinal canal: No prevertebral fluid or swelling. No visible canal hematoma. Calcified plaque within the carotid arteries. Disc levels: Cervical spondylosis with multilevel posterior disc osteophytes, uncovertebral and facet hypertrophy. No high-grade bony spinal canal stenosis. Multilevel neural foraminal narrowing greatest on the right at C3-C4, bilaterally at C5-C6 and bilaterally at C6-C7. Upper chest: No consolidation within the imaged lung apices. No visible pneumothorax. IMPRESSION: CT head: 1. No evidence of acute intracranial abnormality. 2. Stable, mild generalized parenchymal atrophy. CT maxillofacial: 1. No evidence of acute maxillofacial fracture. 2. Mild forehead soft tissue swelling. 3. Paranasal sinus disease as described. Most notably, there is more moderate/severe mucosal thickening within the right maxillary sinus. There is also hyperdensity within the right maxillary sinus which may reflect inspissated secretions or sequela of chronic fungal sinusitis. A small defect within the floor of the right maxillary may reflect an oro-antral fistula. Also of note, adjacent to the mucosal thickening within the inferior left maxillary sinus there is prominent periapical lucency surrounding the posterior left maxillary molar with cortical  breakthrough. This raises the possibility of odontogenic sinusitis. CT cervical spine: 1. No evidence of acute fracture to the cervical spine. 2. Cervical spondylosis as described.  Electronically Signed   By: Jackey Loge DO   On: 11/21/2019 17:58   DG Knee Complete 4 Views Left  Result Date: 11/21/2019 CLINICAL DATA:  Fall with knee pain EXAM: LEFT KNEE - COMPLETE 4+ VIEW COMPARISON:  02/04/2015 FINDINGS: No definitive fracture or malalignment. Mild patellofemoral and medial joint space degenerative change. No significant knee effusion. IMPRESSION: Mild degenerative changes. No acute osseous abnormality. No definite acute osseous abnormality. Electronically Signed   By: Jasmine Pang M.D.   On: 11/21/2019 17:07   DG Knee Complete 4 Views Right  Result Date: 11/21/2019 CLINICAL DATA:  Fall and pain EXAM: RIGHT KNEE - COMPLETE 4+ VIEW COMPARISON:  None. FINDINGS: There is diffuse osteopenia. No definite fracture or dislocation. A healed fracture deformity of the proximal fibula is noted. No large knee joint effusion. IMPRESSION: No definite acute osseous abnormality. Electronically Signed   By: Jonna Clark M.D.   On: 11/21/2019 17:04   DG Hand Complete Left  Result Date: 11/21/2019 CLINICAL DATA:  Fall, pain EXAM: LEFT HAND - COMPLETE 3+ VIEW COMPARISON:  None. FINDINGS: There is no evidence of fracture or dislocation. There is diffuse osteopenia. 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 Soft tissues are unremarkable. IMPRESSION: No acute osseous abnormality. Electronically Signed   By: Jonna Clark M.D.   On: 11/21/2019 17:00   DG Hand Complete Right  Result Date: 11/21/2019 CLINICAL DATA:  Fall, pain EXAM: RIGHT HAND - COMPLETE 3+ VIEW COMPARISON:  None. FINDINGS: There is no evidence of fracture or dislocation. Osteoarthritis at the first [facility] joint and the IP joints. There is diffuse osteopenia. Soft tissues are unremarkable. IMPRESSION: No acute osseous abnormality. Electronically Signed   By: Jonna Clark M.D.   On: 11/21/2019 17:00   DG Hips Bilat W or Wo Pelvis 3-4 Views  Result Date: 11/21/2019 CLINICAL DATA:  Fall and pain EXAM: DG HIP  (WITH OR WITHOUT PELVIS) 3-4V BILAT COMPARISON:  None. FINDINGS: There is no evidence of hip fracture or dislocation, however somewhat limited due to diffuse osteopenia. Degenerative changes in the lower lumbar spine. IMPRESSION: No definite acute osseous abnormality. If there is high clinical suspicion for occult hip fracture or the patient refuses to weightbear, consider further evaluation with MRI. Although CT is expeditious, evidence is lacking regarding accuracy of CT over plain film radiography. Electronically Signed   By: Jonna Clark M.D.   On: 11/21/2019 17:01   CT Maxillofacial Wo Contrast  Result Date: 11/21/2019 CLINICAL DATA:  Head trauma, minor. Facial trauma. Poly trauma, critical, head/cervical spine injury suspected. Additional history provided: 86 year old female via EMS from home status post trip and fall yesterday at 5 p.m. while walking down outdoor steps, abrasions to face and left hand from hitting concrete EXAM: CT HEAD WITHOUT CONTRAST CT MAXILLOFACIAL WITHOUT CONTRAST CT CERVICAL SPINE WITHOUT CONTRAST TECHNIQUE: Contiguous axial images were obtained from the base of the skull through the vertex without intravenous contrast. Multidetector CT imaging of the maxillofacial structures was performed. Multiplanar CT image reconstructions were also generated. A small metallic BB was placed on the right temple in order to reliably differentiate right from left. Multidetector CT imaging of the cervical spine was performed without intravenous contrast. Multiplanar CT image reconstructions were also generated. COMPARISON:  CT head/cervical spine 02/04/2015 FINDINGS: CT HEAD FINDINGS Brain: No  evidence of acute intracranial hemorrhage. No demarcated cortical infarction. No evidence of intracranial mass. No midline shift or extra-axial fluid collection. Stable, mild generalized parenchymal atrophy. Vascular: No hyperdense vessel. Skull: Normal. Negative for fracture or focal lesion. CT MAXILLOFACIAL  FINDINGS Osseous: No evidence of acute maxillofacial fracture. Orbits: No acute abnormality. Sinuses: Moderate/severe mucosal thickening within the right maxillary sinus. Associated hyperdensity within the right maxillary sinus, which may reflect inspissated secretions or sequela of chronic fungal sinusitis. There is a small defect within the floor of the right maxillary sinus (series 9, image 21). There is also moderate mucosal thickening within the inferior left maxillary sinus. There is prominent periapical lucency surrounding the adjacent posterior left maxillary molar with cortical breakthrough (series 10, image 46) and the possibility of odontogenic sinusitis is raised. Additional mild mucosal thickening within the bilateral frontal and ethmoid air cells. Soft tissues: Mild soft tissue swelling along the forehead. CT CERVICAL SPINE FINDINGS Alignment: Straightening of the expected cervical lordosis. Trace C4-C5 and C5-C6 anterolisthesis. Skull base and vertebrae: The basion-dental and atlanto-dental intervals are maintained.No evidence of acute fracture to the cervical spine. Soft tissues and spinal canal: No prevertebral fluid or swelling. No visible canal hematoma. Calcified plaque within the carotid arteries. Disc levels: Cervical spondylosis with multilevel posterior disc osteophytes, uncovertebral and facet hypertrophy. No high-grade bony spinal canal stenosis. Multilevel neural foraminal narrowing greatest on the right at C3-C4, bilaterally at C5-C6 and bilaterally at C6-C7. Upper chest: No consolidation within the imaged lung apices. No visible pneumothorax. IMPRESSION: CT head: 1. No evidence of acute intracranial abnormality. 2. Stable, mild generalized parenchymal atrophy. CT maxillofacial: 1. No evidence of acute maxillofacial fracture. 2. Mild forehead soft tissue swelling. 3. Paranasal sinus disease as described. Most notably, there is more moderate/severe mucosal thickening within the right  maxillary sinus. There is also hyperdensity within the right maxillary sinus which may reflect inspissated secretions or sequela of chronic fungal sinusitis. A small defect within the floor of the right maxillary may reflect an oro-antral fistula. Also of note, adjacent to the mucosal thickening within the inferior left maxillary sinus there is prominent periapical lucency surrounding the posterior left maxillary molar with cortical breakthrough. This raises the possibility of odontogenic sinusitis. CT cervical spine: 1. No evidence of acute fracture to the cervical spine. 2. Cervical spondylosis as described. Electronically Signed   By: Jackey Loge DO   On: 11/21/2019 17:58    EKG: Independently reviewed. Atrial fibrillation, rate 121, PVC's.   Assessment/Plan   1. New atrial fibrillation with RVR  - Presents after a mechanical fall and found to be in atrial fibrillation with RVR  - She denies hx of a fib but reports ~3 months of occasional palpitations  - Rate was 130's during exam in ED but down to <100 prior to any medication  - CHADS-VASc is at least 9 (age x2, gender) and after discussion of risk and benefit of anticoagulation, she wants to proceed with anticoagulation  - Check TSH, echocardiogram, and mag level, start Eliquis, continue cardiac monitoring and consider starting rate-control agent    2. Fall  - Presents after a fall, reports tripping while going down some steps but denies any preceding lightheadedness or LOC, denies frequent falls but fell about a yr ago  - She is able to bear weight in ED and radiographs negative for acute fractures or significant injury  - We discussed risk/benefit of anticoagulation for a fib and patient wants to proceed with anticoagulation  - PT consulted  3. Mild renal insufficiency  - SCr is 1.03 on admission, up from 0.9 remotely  - Renally-dose medications, monitor     DVT prophylaxis: Eliquis  Code Status: Full Family Communication:  Discussed with patient  Disposition Plan: From home, will need PT assessment prior to discharge to help guide disposition.  Consults called: none  Admission status: Inpatient. Patient has multiple acute problems including fall with injuries and new atrial fibrillation with RVR, and new renal insufficiency, is at increased risk of life-threatening complications due to her age, and is not reasonably expected to be safely stabilized for discharge within observation timeframe.    Briscoe Deutscherimothy S Deloma Spindle, MD [facility] Pager: See [email or web address]  If 7AM-7PM, please contact the daytime attending [email or web address]  11/21/2019, 9:43 PM

## 2019-11-21 NOTE — ED Triage Notes (Signed)
86 yo female brought in by [facility] from home s/p trip and fall yesterday at 5pm while walking down outdoor steps and tripped on 2nd to last step. Abrasions to face and left hand from hitting concrete. Pt sts +LOC, but daughter sts no LOC.  Denies blood thinners.   Denies PMH, no home meds  All to PCN

## 2019-11-21 NOTE — ED Provider Notes (Signed)
[facility]-EMERGENCY DEPT Provider Note   CSN: [id number] Arrival date & time: 11/21/19  1446     History Chief Complaint  Patient presents with  . Fall    Angela Leach is a 86 y.o. female.  HPI   Patient presents to the emergency room for evaluation of a fall.  Patient states that yesterday evening she tripped going down her outdoor steps.  She was on the second to last step when she fell forward striking her hands and face primarily on the concrete.  Patient states that she may have lost consciousness briefly.  Patient was eventually able to get up with some assistance.  She did not take any medications and was hoping she would be feeling fine in the morning.  Today patient was having increasing pain.  She decided she needed to come to the hospital to be evaluated.  She has not taken anything for pain today.  She is having pain in her head and face where she did sustain some abrasions.  She is also having pain in her neck and her right shoulder blade.  It hurts to move her arm.  She has bruising and tenderness in the fingers of both of her hands.  Her hips and knees are also bothering her.  Patient was able to walk with some assistance.  She denies any shortness of breath.  No vomiting or diarrhea.  She denies feeling ill or sick prior to this fall.  No past medical history on file.  Patient Active Problem List   Diagnosis Date Noted  . Fracture of right humerus 02/05/2015  . Dizziness 02/04/2015    No past surgical history on file.   OB History   No obstetric history on file.     No family history on file.  Social History   Tobacco Use  . Smoking status: Never Smoker  Substance Use Topics  . Alcohol use: Not on file  . Drug use: Not on file    Home Medications Prior to Admission medications   Medication Sig Start Date End Date Taking? Authorizing Provider  HYDROcodone-acetaminophen (NORCO/VICODIN) 5-325 MG per tablet Take 1-2 tablets by mouth  every 4 (four) hours as needed for moderate pain. 02/05/15   Rama, Maryruth Bun, MD  ondansetron (ZOFRAN) 4 MG tablet Take 1 tablet (4 mg total) by mouth every 6 (six) hours as needed for nausea. 02/05/15   Rama, Maryruth Bun, MD  senna-docusate (SENOKOT-S) 8.6-50 MG per tablet Take 1 tablet by mouth at bedtime as needed for mild constipation. 02/05/15   Rama, Maryruth Bun, MD    Allergies    Penicillins  Review of Systems   Review of Systems  All other systems reviewed and are negative.   Physical Exam Updated Vital Signs BP 119/62   Pulse (!) 109   Temp 98.9 F (37.2 C) (Oral)   Resp 18   Ht 1.676 m (5\' 6" )   Wt 90.7 kg   SpO2 98%   BMI 32.28 kg/m   Physical Exam Vitals and nursing note reviewed.  Constitutional:      General: She is not in acute distress.    Appearance: She is well-developed.  HENT:     Head: Normocephalic.     Comments: Superficial abrasion forehead, edema of the forehead and periorbital region, mild ecchymoses    Right Ear: External ear normal.     Left Ear: External ear normal.  Eyes:     General: No scleral icterus.  Right eye: No discharge.        Left eye: No discharge.     Conjunctiva/sclera: Conjunctivae normal.  Neck:     Trachea: No tracheal deviation.  Cardiovascular:     Rate and Rhythm: Tachycardia present. Rhythm irregular.  Pulmonary:     Effort: Pulmonary effort is normal. No respiratory distress.     Breath sounds: Normal breath sounds. No stridor. No wheezing or rales.  Abdominal:     General: Bowel sounds are normal. There is no distension.     Palpations: Abdomen is soft.     Tenderness: There is no abdominal tenderness. There is no guarding or rebound.  Musculoskeletal:        General: Tenderness present.     Right shoulder: Tenderness present. Decreased range of motion.     Left shoulder: Normal.     Right hand: Tenderness and bony tenderness present. No deformity.     Left hand: Tenderness and bony tenderness present.  No deformity.     Cervical back: Neck supple. Tenderness present.     Thoracic back: Normal.     Lumbar back: Normal.     Right hip: Tenderness present. Normal range of motion.     Left hip: Tenderness present. Normal range of motion.     Right knee: Tenderness present.     Left knee: Tenderness present.     Right ankle: Normal.     Left ankle: Normal.     Comments: Tenderness palpation right periscapular region  Skin:    General: Skin is warm and dry.     Findings: No rash.  Neurological:     Mental Status: She is alert.     Cranial Nerves: No cranial nerve deficit (no facial droop, extraocular movements intact, no slurred speech).     Sensory: No sensory deficit.     Motor: No abnormal muscle tone or seizure activity.     Coordination: Coordination normal.     ED Results / Procedures / Treatments   Labs (all labs ordered are listed, but only abnormal results are displayed) Labs Reviewed  CBC WITH DIFFERENTIAL/PLATELET - Abnormal; Notable for the following components:      Result Value   Hemoglobin 15.6 (*)    HCT 48.2 (*)    All other components within normal limits  BASIC METABOLIC PANEL - Abnormal; Notable for the following components:   BUN 28 (*)    Creatinine, Ser 1.03 (*)    GFR calc non Af Amer 50 (*)    GFR calc Af Amer 58 (*)    All other components within normal limits  SARS CORONAVIRUS 2 (TAT 6-24 HRS)  PROTIME-INR    EKG EKG Interpretation  Date/Time:  Tuesday November 21 2019 15:20:36 EST Ventricular Rate:  121 PR Interval:    QRS Duration: 76 QT Interval:  326 QTC Calculation: 462 R Axis:   15 Text Interpretation: Atrial fibrillation with rapid ventricular response with premature ventricular or aberrantly conducted complexes ST & T wave abnormality, consider inferior ischemia Abnormal ECG atrial fibrillation new since last tracing Confirmed by Linwood DibblesKnapp, Aliza Moret [id number]) on 11/21/2019 3:26:15 PM   Radiology DG Chest 2 View  Result Date:  11/21/2019 CLINICAL DATA:  Fall, pain EXAM: CHEST - 2 VIEW COMPARISON:  Feb 04, 2015 FINDINGS: The heart size and mediastinal contours are within normal limits. Aortic knob calcifications. Both lungs are clear. The visualized skeletal structures are unremarkable. IMPRESSION: No active cardiopulmonary disease. Electronically Signed   By: Kandis FantasiaBindu  Avutu M.D.   On: 11/21/2019 16:55   DG Scapula Right  Result Date: 11/21/2019 CLINICAL DATA:  Fall, pain EXAM: RIGHT SCAPULA - 2+ VIEWS COMPARISON:  Feb 04, 2016 FINDINGS: There is no evidence of fracture or other focal bone lesions. Healed fracture deformity of the proximal humerus. Soft tissues are unremarkable. IMPRESSION: Negative. Electronically Signed   By: Prudencio Pair M.D.   On: 11/21/2019 16:59   DG Shoulder Right  Result Date: 11/21/2019 CLINICAL DATA:  Fall EXAM: RIGHT SHOULDER - 2+ VIEW COMPARISON:  Feb 04, 2015 FINDINGS: There is a healed fracture deformity of the right humerus. Overlying surgical anchors. Well corticated ossicle overlying the lateral humeral head. No acute fracture or dislocation. AC joint arthrosis. IMPRESSION: No acute osseous abnormality. Electronically Signed   By: Prudencio Pair M.D.   On: 11/21/2019 16:58   CT Head Wo Contrast  Result Date: 11/21/2019 CLINICAL DATA:  Head trauma, minor. Facial trauma. Poly trauma, critical, head/cervical spine injury suspected. Additional history provided: 86 year old female via EMS from home status post trip and fall yesterday at 5 p.m. while walking down outdoor steps, abrasions to face and left hand from hitting concrete EXAM: CT HEAD WITHOUT CONTRAST CT MAXILLOFACIAL WITHOUT CONTRAST CT CERVICAL SPINE WITHOUT CONTRAST TECHNIQUE: Contiguous axial images were obtained from the base of the skull through the vertex without intravenous contrast. Multidetector CT imaging of the maxillofacial structures was performed. Multiplanar CT image reconstructions were also generated. A small metallic BB was  placed on the right temple in order to reliably differentiate right from left. Multidetector CT imaging of the cervical spine was performed without intravenous contrast. Multiplanar CT image reconstructions were also generated. COMPARISON:  CT head/cervical spine 02/04/2015 FINDINGS: CT HEAD FINDINGS Brain: No evidence of acute intracranial hemorrhage. No demarcated cortical infarction. No evidence of intracranial mass. No midline shift or extra-axial fluid collection. Stable, mild generalized parenchymal atrophy. Vascular: No hyperdense vessel. Skull: Normal. Negative for fracture or focal lesion. CT MAXILLOFACIAL FINDINGS Osseous: No evidence of acute maxillofacial fracture. Orbits: No acute abnormality. Sinuses: Moderate/severe mucosal thickening within the right maxillary sinus. Associated hyperdensity within the right maxillary sinus, which may reflect inspissated secretions or sequela of chronic fungal sinusitis. There is a small defect within the floor of the right maxillary sinus (series 9, image 21). There is also moderate mucosal thickening within the inferior left maxillary sinus. There is prominent periapical lucency surrounding the adjacent posterior left maxillary molar with cortical breakthrough (series 10, image 46) and the possibility of odontogenic sinusitis is raised. Additional mild mucosal thickening within the bilateral frontal and ethmoid air cells. Soft tissues: Mild soft tissue swelling along the forehead. CT CERVICAL SPINE FINDINGS Alignment: Straightening of the expected cervical lordosis. Trace C4-C5 and C5-C6 anterolisthesis. Skull base and vertebrae: The basion-dental and atlanto-dental intervals are maintained.No evidence of acute fracture to the cervical spine. Soft tissues and spinal canal: No prevertebral fluid or swelling. No visible canal hematoma. Calcified plaque within the carotid arteries. Disc levels: Cervical spondylosis with multilevel posterior disc osteophytes,  uncovertebral and facet hypertrophy. No high-grade bony spinal canal stenosis. Multilevel neural foraminal narrowing greatest on the right at C3-C4, bilaterally at C5-C6 and bilaterally at C6-C7. Upper chest: No consolidation within the imaged lung apices. No visible pneumothorax. IMPRESSION: CT head: 1. No evidence of acute intracranial abnormality. 2. Stable, mild generalized parenchymal atrophy. CT maxillofacial: 1. No evidence of acute maxillofacial fracture. 2. Mild forehead soft tissue swelling. 3. Paranasal sinus disease as described. Most notably, there is  more moderate/severe mucosal thickening within the right maxillary sinus. There is also hyperdensity within the right maxillary sinus which may reflect inspissated secretions or sequela of chronic fungal sinusitis. A small defect within the floor of the right maxillary may reflect an oro-antral fistula. Also of note, adjacent to the mucosal thickening within the inferior left maxillary sinus there is prominent periapical lucency surrounding the posterior left maxillary molar with cortical breakthrough. This raises the possibility of odontogenic sinusitis. CT cervical spine: 1. No evidence of acute fracture to the cervical spine. 2. Cervical spondylosis as described. Electronically Signed   By: Jackey Loge DO   On: 11/21/2019 17:58   CT Cervical Spine Wo Contrast  Result Date: 11/21/2019 CLINICAL DATA:  Head trauma, minor. Facial trauma. Poly trauma, critical, head/cervical spine injury suspected. Additional history provided: 86 year old female via EMS from home status post trip and fall yesterday at 5 p.m. while walking down outdoor steps, abrasions to face and left hand from hitting concrete EXAM: CT HEAD WITHOUT CONTRAST CT MAXILLOFACIAL WITHOUT CONTRAST CT CERVICAL SPINE WITHOUT CONTRAST TECHNIQUE: Contiguous axial images were obtained from the base of the skull through the vertex without intravenous contrast. Multidetector CT imaging of the  maxillofacial structures was performed. Multiplanar CT image reconstructions were also generated. A small metallic BB was placed on the right temple in order to reliably differentiate right from left. Multidetector CT imaging of the cervical spine was performed without intravenous contrast. Multiplanar CT image reconstructions were also generated. COMPARISON:  CT head/cervical spine 02/04/2015 FINDINGS: CT HEAD FINDINGS Brain: No evidence of acute intracranial hemorrhage. No demarcated cortical infarction. No evidence of intracranial mass. No midline shift or extra-axial fluid collection. Stable, mild generalized parenchymal atrophy. Vascular: No hyperdense vessel. Skull: Normal. Negative for fracture or focal lesion. CT MAXILLOFACIAL FINDINGS Osseous: No evidence of acute maxillofacial fracture. Orbits: No acute abnormality. Sinuses: Moderate/severe mucosal thickening within the right maxillary sinus. Associated hyperdensity within the right maxillary sinus, which may reflect inspissated secretions or sequela of chronic fungal sinusitis. There is a small defect within the floor of the right maxillary sinus (series 9, image 21). There is also moderate mucosal thickening within the inferior left maxillary sinus. There is prominent periapical lucency surrounding the adjacent posterior left maxillary molar with cortical breakthrough (series 10, image 46) and the possibility of odontogenic sinusitis is raised. Additional mild mucosal thickening within the bilateral frontal and ethmoid air cells. Soft tissues: Mild soft tissue swelling along the forehead. CT CERVICAL SPINE FINDINGS Alignment: Straightening of the expected cervical lordosis. Trace C4-C5 and C5-C6 anterolisthesis. Skull base and vertebrae: The basion-dental and atlanto-dental intervals are maintained.No evidence of acute fracture to the cervical spine. Soft tissues and spinal canal: No prevertebral fluid or swelling. No visible canal hematoma. Calcified  plaque within the carotid arteries. Disc levels: Cervical spondylosis with multilevel posterior disc osteophytes, uncovertebral and facet hypertrophy. No high-grade bony spinal canal stenosis. Multilevel neural foraminal narrowing greatest on the right at C3-C4, bilaterally at C5-C6 and bilaterally at C6-C7. Upper chest: No consolidation within the imaged lung apices. No visible pneumothorax. IMPRESSION: CT head: 1. No evidence of acute intracranial abnormality. 2. Stable, mild generalized parenchymal atrophy. CT maxillofacial: 1. No evidence of acute maxillofacial fracture. 2. Mild forehead soft tissue swelling. 3. Paranasal sinus disease as described. Most notably, there is more moderate/severe mucosal thickening within the right maxillary sinus. There is also hyperdensity within the right maxillary sinus which may reflect inspissated secretions or sequela of chronic fungal sinusitis. A small defect  within the floor of the right maxillary may reflect an oro-antral fistula. Also of note, adjacent to the mucosal thickening within the inferior left maxillary sinus there is prominent periapical lucency surrounding the posterior left maxillary molar with cortical breakthrough. This raises the possibility of odontogenic sinusitis. CT cervical spine: 1. No evidence of acute fracture to the cervical spine. 2. Cervical spondylosis as described. Electronically Signed   By: Jackey Loge DO   On: 11/21/2019 17:58   DG Knee Complete 4 Views Left  Result Date: 11/21/2019 CLINICAL DATA:  Fall with knee pain EXAM: LEFT KNEE - COMPLETE 4+ VIEW COMPARISON:  02/04/2015 FINDINGS: No definitive fracture or malalignment. Mild patellofemoral and medial joint space degenerative change. No significant knee effusion. IMPRESSION: Mild degenerative changes. No acute osseous abnormality. No definite acute osseous abnormality. Electronically Signed   By: Jasmine Pang M.D.   On: 11/21/2019 17:07   DG Knee Complete 4 Views Right  Result  Date: 11/21/2019 CLINICAL DATA:  Fall and pain EXAM: RIGHT KNEE - COMPLETE 4+ VIEW COMPARISON:  None. FINDINGS: There is diffuse osteopenia. No definite fracture or dislocation. A healed fracture deformity of the proximal fibula is noted. No large knee joint effusion. IMPRESSION: No definite acute osseous abnormality. Electronically Signed   By: Jonna Clark M.D.   On: 11/21/2019 17:04   DG Hand Complete Left  Result Date: 11/21/2019 CLINICAL DATA:  Fall, pain EXAM: LEFT HAND - COMPLETE 3+ VIEW COMPARISON:  None. FINDINGS: There is no evidence of fracture or dislocation. There is diffuse osteopenia. 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 Soft tissues are unremarkable. IMPRESSION: No acute osseous abnormality. Electronically Signed   By: Jonna Clark M.D.   On: 11/21/2019 17:00   DG Hand Complete Right  Result Date: 11/21/2019 CLINICAL DATA:  Fall, pain EXAM: RIGHT HAND - COMPLETE 3+ VIEW COMPARISON:  None. FINDINGS: There is no evidence of fracture or dislocation. Osteoarthritis at the first [facility] joint and the IP joints. There is diffuse osteopenia. Soft tissues are unremarkable. IMPRESSION: No acute osseous abnormality. Electronically Signed   By: Jonna Clark M.D.   On: 11/21/2019 17:00   DG Hips Bilat W or Wo Pelvis 3-4 Views  Result Date: 11/21/2019 CLINICAL DATA:  Fall and pain EXAM: DG HIP (WITH OR WITHOUT PELVIS) 3-4V BILAT COMPARISON:  None. FINDINGS: There is no evidence of hip fracture or dislocation, however somewhat limited due to diffuse osteopenia. Degenerative changes in the lower lumbar spine. IMPRESSION: No definite acute osseous abnormality. If there is high clinical suspicion for occult hip fracture or the patient refuses to weightbear, consider further evaluation with MRI. Although CT is expeditious, evidence is lacking regarding accuracy of CT over plain film radiography. Electronically Signed   By: Jonna Clark M.D.   On: 11/21/2019 17:01   CT  Maxillofacial Wo Contrast  Result Date: 11/21/2019 CLINICAL DATA:  Head trauma, minor. Facial trauma. Poly trauma, critical, head/cervical spine injury suspected. Additional history provided: 86 year old female via EMS from home status post trip and fall yesterday at 5 p.m. while walking down outdoor steps, abrasions to face and left hand from hitting concrete EXAM: CT HEAD WITHOUT CONTRAST CT MAXILLOFACIAL WITHOUT CONTRAST CT CERVICAL SPINE WITHOUT CONTRAST TECHNIQUE: Contiguous axial images were obtained from the base of the skull through the vertex without intravenous contrast. Multidetector CT imaging of the maxillofacial structures was performed. Multiplanar CT image reconstructions were also generated. A small metallic BB was placed on the right temple  in order to reliably differentiate right from left. Multidetector CT imaging of the cervical spine was performed without intravenous contrast. Multiplanar CT image reconstructions were also generated. COMPARISON:  CT head/cervical spine 02/04/2015 FINDINGS: CT HEAD FINDINGS Brain: No evidence of acute intracranial hemorrhage. No demarcated cortical infarction. No evidence of intracranial mass. No midline shift or extra-axial fluid collection. Stable, mild generalized parenchymal atrophy. Vascular: No hyperdense vessel. Skull: Normal. Negative for fracture or focal lesion. CT MAXILLOFACIAL FINDINGS Osseous: No evidence of acute maxillofacial fracture. Orbits: No acute abnormality. Sinuses: Moderate/severe mucosal thickening within the right maxillary sinus. Associated hyperdensity within the right maxillary sinus, which may reflect inspissated secretions or sequela of chronic fungal sinusitis. There is a small defect within the floor of the right maxillary sinus (series 9, image 21). There is also moderate mucosal thickening within the inferior left maxillary sinus. There is prominent periapical lucency surrounding the adjacent posterior left maxillary molar  with cortical breakthrough (series 10, image 46) and the possibility of odontogenic sinusitis is raised. Additional mild mucosal thickening within the bilateral frontal and ethmoid air cells. Soft tissues: Mild soft tissue swelling along the forehead. CT CERVICAL SPINE FINDINGS Alignment: Straightening of the expected cervical lordosis. Trace C4-C5 and C5-C6 anterolisthesis. Skull base and vertebrae: The basion-dental and atlanto-dental intervals are maintained.No evidence of acute fracture to the cervical spine. Soft tissues and spinal canal: No prevertebral fluid or swelling. No visible canal hematoma. Calcified plaque within the carotid arteries. Disc levels: Cervical spondylosis with multilevel posterior disc osteophytes, uncovertebral and facet hypertrophy. No high-grade bony spinal canal stenosis. Multilevel neural foraminal narrowing greatest on the right at C3-C4, bilaterally at C5-C6 and bilaterally at C6-C7. Upper chest: No consolidation within the imaged lung apices. No visible pneumothorax. IMPRESSION: CT head: 1. No evidence of acute intracranial abnormality. 2. Stable, mild generalized parenchymal atrophy. CT maxillofacial: 1. No evidence of acute maxillofacial fracture. 2. Mild forehead soft tissue swelling. 3. Paranasal sinus disease as described. Most notably, there is more moderate/severe mucosal thickening within the right maxillary sinus. There is also hyperdensity within the right maxillary sinus which may reflect inspissated secretions or sequela of chronic fungal sinusitis. A small defect within the floor of the right maxillary may reflect an oro-antral fistula. Also of note, adjacent to the mucosal thickening within the inferior left maxillary sinus there is prominent periapical lucency surrounding the posterior left maxillary molar with cortical breakthrough. This raises the possibility of odontogenic sinusitis. CT cervical spine: 1. No evidence of acute fracture to the cervical spine. 2.  Cervical spondylosis as described. Electronically Signed   By: Jackey Loge DO   On: 11/21/2019 17:58    Procedures Procedures (including critical care time)  Medications Ordered in ED Medications  morphine 4 MG/ML injection 4 mg (4 mg Intravenous Refused 11/21/19 1548)  sodium chloride 0.9 % bolus 500 mL (0 mLs Intravenous Stopped 11/21/19 1915)    Followed by  0.9 %  sodium chloride infusion (1,000 mLs Intravenous New Bag/Given 11/21/19 1550)  acetaminophen (TYLENOL) tablet 650 mg (650 mg Oral Given 11/21/19 1551)    ED Course  I have reviewed the triage vital signs and the nursing notes.  Pertinent labs & imaging results that were available during my care of the patient were reviewed by me and considered in my medical decision making (see chart for details).  Clinical Course as of Nov 20 1926  Tue Nov 21, 2019  1729 nl  DG Chest 2 View [JK]  1729 nl  DG Shoulder  Right [JK]  1729 nl  DG Scapula Right [JK]  1729 nl  DG Hand Complete Left [JK]  1730 nl  DG Hand Complete Right [JK]  1730 nl  DG Hips Bilat W or Wo Pelvis 3-4 Views [JK]  1730 nl  DG Knee Complete 4 Views Right [JK]  1731 nl  DG Knee Complete 4 Views Left [JK]  1920 Pt states she feels weak and unsteady after the fall.  Difficulty ambulating    [JK]    Clinical Course User Index [JK] Linwood DibblesKnapp, Othella Slappey, MD   MDM Rules/Calculators/A&P                      ED workup does not show any serious inuries.  Incidental findings noted on imaging.  Sinsusitis on CT scan.  EKG shows atrial fibrillation.  Pt is not aware of that diagnosis.  Pt attempted to ambulate.  Feels unsteady with her injuries.  Considering her atrial fibrillation and her injuries, will consult with medical service for observation, consider PT, cardiology eval. Final Clinical Impression(s) / ED Diagnoses Final diagnoses:  Sinusitis, unspecified chronicity, unspecified location  Contusion of face, initial encounter  Atrial fibrillation, unspecified type  [facility](HCC)      Linwood DibblesKnapp, Kaedon Fanelli, MD 11/21/19 1930

## 2019-11-22 ENCOUNTER — Inpatient Hospital Stay (HOSPITAL_COMMUNITY): Payer: Medicare HMO

## 2019-11-22 DIAGNOSIS — I4891 Unspecified atrial fibrillation: Secondary | ICD-10-CM

## 2019-11-22 LAB — CBC
HCT: 43.8 % (ref 36.0–46.0)
Hemoglobin: 14 g/dL (ref 12.0–15.0)
MCH: 32.2 pg (ref 26.0–34.0)
MCHC: 32 g/dL (ref 30.0–36.0)
MCV: 100.7 fL — ABNORMAL HIGH (ref 80.0–100.0)
Platelets: 162 10*3/uL (ref 150–400)
RBC: 4.35 MIL/uL (ref 3.87–5.11)
RDW: 13 % (ref 11.5–15.5)
WBC: 4.9 10*3/uL (ref 4.0–10.5)
nRBC: 0 % (ref 0.0–0.2)

## 2019-11-22 LAB — COMPREHENSIVE METABOLIC PANEL
ALT: 12 U/L (ref 0–44)
AST: 19 U/L (ref 15–41)
Albumin: 2.9 g/dL — ABNORMAL LOW (ref 3.5–5.0)
Alkaline Phosphatase: 42 U/L (ref 38–126)
Anion gap: 8 (ref 5–15)
BUN: 19 mg/dL (ref 8–23)
CO2: 23 mmol/L (ref 22–32)
Calcium: 8.4 mg/dL — ABNORMAL LOW (ref 8.9–10.3)
Chloride: 110 mmol/L (ref 98–111)
Creatinine, Ser: 0.73 mg/dL (ref 0.44–1.00)
GFR calc Af Amer: >60 mL/min (ref >60)
GFR calc non Af Amer: >60 mL/min (ref >60)
Glucose, Bld: 90 mg/dL (ref 70–99)
Potassium: 4.2 mmol/L (ref 3.5–5.1)
Sodium: 141 mmol/L (ref 135–145)
Total Bilirubin: 0.8 mg/dL (ref 0.3–1.2)
Total Protein: 6.1 g/dL — ABNORMAL LOW (ref 6.5–8.1)

## 2019-11-22 LAB — ECHOCARDIOGRAM COMPLETE
Height: 65 in
Weight: 3167.57 oz

## 2019-11-22 LAB — T4, FREE: Free T4: 0.85 ng/dL (ref 0.61–1.12)

## 2019-11-22 LAB — SARS CORONAVIRUS 2 (TAT 6-24 HRS): SARS Coronavirus 2: NEGATIVE

## 2019-11-22 LAB — MAGNESIUM: Magnesium: 2.1 mg/dL (ref 1.7–2.4)

## 2019-11-22 LAB — TSH: TSH: 2.448 u[IU]/mL (ref 0.350–4.500)

## 2019-11-22 MED ORDER — TRAMADOL HCL 50 MG PO TABS
50.0000 mg | ORAL_TABLET | Freq: Two times a day (BID) | ORAL | Status: DC | PRN
  Administered 2019-11-22 – 2019-11-23 (×2): 50 mg via ORAL
  Filled 2019-11-22 (×2): qty 1

## 2019-11-22 MED ORDER — TRAMADOL HCL 50 MG PO TABS
50.0000 mg | ORAL_TABLET | Freq: Once | ORAL | Status: AC
  Administered 2019-11-22: 50 mg via ORAL
  Filled 2019-11-22: qty 1

## 2019-11-22 NOTE — Progress Notes (Signed)
Occupational Therapy Evaluation Patient Details Name: Angela Leach MRN: [id number] DOB: 11/27/1932 Today's Date: 11/22/2019    History of Present Illness Angela Leach is a 86 y.o. female who describes herself as generally healthy, denying any chronic medical problems, and now presenting to the emergency department for evaluation of pain in her hands, face, and right shoulder after a fall.  Patient reports that she was going down some steps, tripped on the second to last step, and fell forward onto her hands but also hit her face.  She does not believe that she lost consciousness and pain was tolerable initially, but throughout the course of the day, she had increasing pain and swelling in her hands and face, also experiencing pain in the right shoulder, and decided to come in for evaluation.  She denies any lightheadedness or chest pain, or palpitations preceding the fall and believes that she stepped too far forward and missed the second to last step.  She denies frequent falls but notes that she did fall about 1 year ago.  She denies any recent fevers, chills, cough, or shortness of breath.  She does note occasional palpitations for the past few months, mainly at night when she is laying in bed, and described as heart racing.  She reports that these episodes are self-limited and seem to resolve after several minutes.   Clinical Impression   Patient reports living in multi level home with daughter. Patient's bedroom and bathroom on main level. Patient reports 1 fall last year resulting in a left humerus fracture. Patient reports performing all self-care tasks and functional mobility with Independence at [facility]. Overall, patient requires set-up to CGA for self-care tasks and CGA for functional transfers. Patient reported increased dizziness during sit to stand transfer and after tying of shoelaces EOB.  BP in siitting 135/53, standing 95/69. Patient was able to complete toileting task with use of BSC  with CGA for clothing management. Patient was able to don B LE grip socks and shoes while sitting EOB. Patient will benefit from continued skilled acute OT services.     Follow Up Recommendations  Home health OT    Equipment Recommendations  (patient already has BSC and walker)    Recommendations for Other Services       Precautions / Restrictions Precautions Precautions: Fall Restrictions Weight Bearing Restrictions: No      Mobility Bed Mobility Overal bed mobility: Modified Independent             General bed mobility comments: extra time for supine>sit   Transfers Overall transfer level: Needs assistance   Transfers: Sit to/from Stand;Stand Pivot Transfers Sit to Stand: Supervision Stand pivot transfers: Min guard            Balance Overall balance assessment: Needs assistance   Sitting balance-Leahy Scale: Good       Standing balance-Leahy Scale: Fair                             ADL either performed or assessed with clinical judgement   ADL Overall ADL's : Needs assistance/impaired Eating/Feeding: Independent   Grooming: Wash/dry face;Set up   Upper Body Bathing: Set up   Lower Body Bathing: Min guard   Upper Body Dressing : Set up   Lower Body Dressing: Supervision/safety   Toilet Transfer: Min guard   Toileting- Clothing Manipulation and Hygiene: Min guard       Functional mobility during ADLs: Min guard  Vision Baseline Vision/History: Wears glasses Wears Glasses: Reading only       Perception     Praxis      Pertinent Vitals/Pain Pain Assessment: 0-10 Pain Score: 3  Pain Location: Left hip area  Pain Descriptors / Indicators: Discomfort Pain Intervention(s): Repositioned     Hand Dominance Right   Extremity/Trunk Assessment Upper Extremity Assessment Upper Extremity Assessment: Generalized weakness   Lower Extremity Assessment Lower Extremity Assessment: Defer to PT evaluation        Communication Communication Communication: No difficulties   Cognition Arousal/Alertness: Awake/alert Behavior During Therapy: WFL for tasks assessed/performed Overall Cognitive Status: Within Functional Limits for tasks assessed                                     General Comments  increased dizziness during sit to stand transfer and after tying of shoelaces EOB.  BP in siitting 135/53, standing 95/69    Exercises     Shoulder Instructions      Home Living Family/patient expects to be discharged to:: Private residence Living Arrangements: Children Available Help at Discharge: Family Type of Home: House Home Access: Stairs to enter Technical brewer of Steps: 3 Entrance Stairs-Rails: Left Home Layout: Multi-level;Able to live on main level with bedroom/bathroom     Bathroom Shower/Tub: Occupational psychologist: Handicapped height     Home Equipment: Shower seat   Additional Comments: patient has BSC, walker, and cane in home       Prior Functioning/Environment Level of Independence: Independent                 OT Problem List: Decreased strength;Decreased activity tolerance;Impaired balance (sitting and/or standing);Decreased safety awareness;Decreased knowledge of use of DME or AE      OT Treatment/Interventions: Self-care/ADL training;Energy conservation;DME and/or AE instruction;Therapeutic activities;Patient/family education;Balance training;Therapeutic exercise    OT Goals(Current goals can be found in the care plan section) Acute Rehab OT Goals Patient Stated Goal: to go home OT Goal Formulation: With patient Time For Goal Achievement: 12/06/19 Potential to Achieve Goals: Good ADL Goals Pt Will Perform Grooming: with modified independence;standing Pt Will Perform Lower Body Dressing: with modified independence;sit to/from stand Pt Will Transfer to Toilet: with modified independence;ambulating Pt Will Perform Toileting -  Clothing Manipulation and hygiene: with modified independence  OT Frequency: Min 2X/week   Barriers to D/C:            Co-evaluation PT/OT/SLP Co-Evaluation/Treatment: Yes Reason for Co-Treatment: For patient/therapist safety;To address functional/ADL transfers   OT goals addressed during session: ADL's and self-care      AM-PAC OT "6 Clicks" Daily Activity     Outcome Measure Help from another person eating meals?: None Help from another person taking care of personal grooming?: A Little Help from another person toileting, which includes using toliet, bedpan, or urinal?: A Little Help from another person bathing (including washing, rinsing, drying)?: A Little Help from another person to put on and taking off regular upper body clothing?: A Little Help from another person to put on and taking off regular lower body clothing?: A Little 6 Click Score: 19   End of Session Nurse Communication: Mobility status  Activity Tolerance: Patient tolerated treatment well Patient left: in bed  OT Visit Diagnosis: Muscle weakness (generalized) (M62.81);Unsteadiness on feet (R26.81)                Time: 7867-6720 OT  Time Calculation (min): 43 min Charges:  OT General Charges $OT Visit: 1 Visit OT Evaluation $OT Eval Moderate Complexity: 1 Mod OT Treatments $Self Care/Home Management : 23-37 mins $Therapeutic Activity: 8-22 mins  Talis Iwan OTR/L   Song Garris 11/22/2019, 3:55 PM

## 2019-11-22 NOTE — Discharge Instructions (Signed)

## 2019-11-22 NOTE — Progress Notes (Signed)
PT c/o itching due to tylenol but said she did not want anything for it. Further c/o 9 out of 10 pain in the back of  her head. Attending notified and 50mg  of tramadol ordered and given

## 2019-11-22 NOTE — Evaluation (Signed)
Physical Therapy Evaluation Patient Details Name: Angela Leach MRN: [id number] DOB: Feb 01, 1933 Today's Date: 11/22/2019   History of Present Illness  86 yo female admitted with A fib with RVR after sustaining a fall at home. X-rays (-). Hx of prior humerus fx, dizziness.  Clinical Impression  On eval, pt was Min guard assist for mobility. She walked ~100 feet with use of a RW for safety. Pt c/o dizziness during session (with sit>stand, and bending over-see vitals BP section). Will continue to follow and progress activity as tolerated. Recommend HHPT, if pt is agreeable.     Follow Up Recommendations Home health PT;Supervision - Intermittent    Equipment Recommendations  None recommended by PT    Recommendations for Other Services       Precautions / Restrictions Precautions Precautions: Fall Precaution Comments: monitor BP Restrictions Weight Bearing Restrictions: No      Mobility  Bed Mobility Overal bed mobility: Modified Independent             General bed mobility comments: extra time for supine>sit. HOB elevated.  Transfers Overall transfer level: Needs assistance Equipment used: None Transfers: Sit to/from Stand Sit to Stand: Min guard Stand pivot transfers: Min guard       General transfer comment: close guard for safety. pt c/o dizziness. cues to sit back down for safety reasons. assess bp: 135/53  Ambulation/Gait Ambulation/Gait assistance: Min guard Gait Distance (Feet): 100 Feet Assistive device: Rolling walker (2 wheeled) Gait Pattern/deviations: Step-through pattern;Decreased stride length     General Gait Details: used RW for safety. Pt c/o mild dizziness but she was able to complete task.  Stairs            Wheelchair Mobility    Modified Rankin (Stroke Patients Only)       Balance Overall balance assessment: Needs assistance;History of Falls   Sitting balance-Leahy Scale: Good     Standing balance support: During  functional activity Standing balance-Leahy Scale: Fair                               Pertinent Vitals/Pain Pain Assessment: 0-10 Pain Score: 3  Pain Location: Left hip area  Pain Descriptors / Indicators: Discomfort Pain Intervention(s): Repositioned    Home Living Family/patient expects to be discharged to:: Private residence Living Arrangements: Children Available Help at Discharge: Family Type of Home: House Home Access: Stairs to enter Entrance Stairs-Rails: Left Entrance Stairs-Number of Steps: 3 Home Layout: Multi-level;Able to live on main level with bedroom/bathroom Home Equipment: Shower seat Additional Comments: patient has BSC, walker, and cane in home     Prior Function Level of Independence: Independent               Hand Dominance   Dominant Hand: Right    Extremity/Trunk Assessment   Upper Extremity Assessment Upper Extremity Assessment: Defer to OT evaluation    Lower Extremity Assessment Lower Extremity Assessment: Generalized weakness    Cervical / Trunk Assessment Cervical / Trunk Assessment: Normal  Communication   Communication: No difficulties  Cognition Arousal/Alertness: Awake/alert Behavior During Therapy: WFL for tasks assessed/performed Overall Cognitive Status: Within Functional Limits for tasks assessed                                 General Comments: talkative      General Comments General comments (skin integrity, edema, etc.): increased  dizziness during sit to stand transfer and after tying of shoelaces EOB.  BP in siitting 135/53, standing 95/69    Exercises     Assessment/Plan    PT Assessment Patient needs continued PT services  PT Problem List Decreased strength;Decreased mobility;Decreased activity tolerance;Decreased balance;Decreased knowledge of use of DME       PT Treatment Interventions DME instruction;Gait training;Therapeutic activities;Therapeutic exercise;Balance  training;Functional mobility training;Patient/family education    PT Goals (Current goals can be found in the Care Plan section)  Acute Rehab PT Goals Patient Stated Goal: to go home PT Goal Formulation: With patient Time For Goal Achievement: 12/13/19 Potential to Achieve Goals: Good    Frequency Min 3X/week   Barriers to discharge        Co-evaluation   Reason for Co-Treatment: For patient/therapist safety;To address functional/ADL transfers   OT goals addressed during session: ADL's and self-care       AM-PAC PT "6 Clicks" Mobility  Outcome Measure Help needed turning from your back to your side while in a flat bed without using bedrails?: None Help needed moving from lying on your back to sitting on the side of a flat bed without using bedrails?: None Help needed moving to and from a bed to a chair (including a wheelchair)?: A Little Help needed standing up from a chair using your arms (e.g., wheelchair or bedside chair)?: A Little Help needed to walk in hospital room?: A Little Help needed climbing 3-5 steps with a railing? : A Little 6 Click Score: 20    End of Session Equipment Utilized During Treatment: Gait belt Activity Tolerance: Patient tolerated treatment well Patient left: in chair;with call bell/phone within reach;with nursing/sitter in room   PT Visit Diagnosis: Unsteadiness on feet (R26.81);History of falling (Z91.81);Difficulty in walking, not elsewhere classified (R26.2);Muscle weakness (generalized) (M62.81)    Time: 5397-6734 PT Time Calculation (min) (ACUTE ONLY): 28 min   Charges:   PT Evaluation $PT Eval Moderate Complexity: 1 Mod             Cledith Kamiya P, PT Acute Rehabilitation

## 2019-11-22 NOTE — Progress Notes (Signed)
Call placed to patients daughter Lanora Manis [phone]. No question at this time.

## 2019-11-22 NOTE — Progress Notes (Signed)
PROGRESS NOTE  Angela Leach  ZOX:[id number] DOB: Dec 11, 1932 DOA: 11/21/2019 PCP: Clovis RileyMitchell, L.August Saucerean, MD  Brief Narrative: Angela Leach is a healthy 86 y.o. female who presented to the ED 2/23 after a fall down stairs at home. She put only half a foot on the 2nd to last stair and fell down striking her head and outstretched arms with immediate pain and bleeding on forehead. She denies syncopal symptoms. In the ED she was found to be tachycardic with newly discovered atrial fibrillation. Radiographs of the hip, hands, right shoulder, knees, and right scapula are negative for definite acute osseous abnormalities and chest x-ray is also negative for acute findings.  CT head is negative for acute intracranial abnormality, no acute fracture noted on cervical spine CT, and maxillofacial CT is notable for sinus disease.  Patient was given IV fluids and analgesia in the emergency department with spontaneous resolution of rapid atrial fibrillation. She remained unsteady with ambulation and admission was requested. Eliquis was started.   Assessment & Plan: Principal Problem:   Atrial fibrillation with RVR (HCC) Active Problems:   Fall at home, initial encounter   Mild renal insufficiency   Contusion of face  New onset atrial fibrillation: Pt at risk of hemorrhage due to falls but also stroke due to AFib. Note atria not enlarged on echocardiogram. Unclear duration, though has had palpitations for a few months prior to admission. TSH and T3 wnl.  - Continue eliquis due to age and gender. Given her obvious risk of falls, anticoagulation could be reconsidered based on ongoing fall risk after PT/OT.  - Rate is controlled/converted rhythm at this time, so not starting medications currently.   Fall at home:  - PT/OT, will order home health and DME  Forehead laceration, multiple sites of trauma: Without acute fracture discovered.  - Tylenol and tramadol prn pain. Not a true allergy to tylenol, and would  strongly prefer to avoid NSAIDs w/AKI and now on anticoagulation.   AKI: Improved craetinine from 1.03 > 0.73.  - Ok to use DOAC. Avoid nephrotoxins.   DVT prophylaxis: Eliquis Code Status: Full Family Communication: None at bedside Disposition Plan: Home 2/25 if heart rhythm/rate remains controlled overnight. Will get [facility] services.  Consultants:   None  Procedures:  Echocardiogram 11/22/2019:  1. Left ventricular ejection fraction, by estimation, is 55 to 60%. The  left ventricle has normal function. The left ventricle has no regional  wall motion abnormalities. Left ventricular diastolic parameters are  consistent with age-related delayed  relaxation (normal).  2. Right ventricular systolic function is normal. The right ventricular  size is normal. There is normal pulmonary artery systolic pressure. The  estimated right ventricular systolic pressure is 23.7 mmHg.  3. The mitral valve is degenerative. No evidence of mitral valve  regurgitation. No evidence of mitral stenosis.  4. The aortic valve is tricuspid. Aortic valve regurgitation is not  visualized. No aortic stenosis is present.  5. The inferior vena cava is normal in size with <50% respiratory  variability, suggesting right atrial pressure of 8 mmHg.   Antimicrobials:  None   Subjective: Pain is uncontrolled, feels she has allergy to tylenol in the form of a hoarse voice. No dyspnea, stridor, hives/rash. Tramadol posed no adverse effects.   Objective: Vitals:   11/22/19 0447 11/22/19 0840 11/22/19 0841 11/22/19 1314  BP: 109/62 111/64 111/64 113/60  Pulse: 70 93 70 64  Resp: 17  18 18   Temp: 98.4 F (36.9 C) 98.4 F (36.9 C) 98.4  F (36.9 C) 97.9 F (36.6 C)  TempSrc: Oral Oral Oral Oral  SpO2: 95% 94% 94% 96%  Weight:      Height:        Intake/Output Summary (Last 24 hours) at 11/22/2019 1752 Last data filed at 11/22/2019 1638 Gross per 24 hour  Intake 1352.11 ml  Output 700 ml  Net 652.11 ml     Filed Weights   11/21/19 1506 11/21/19 2053  Weight: 90.7 kg 89.8 kg    Gen: 86 y.o. female in no distress Pulm: Non-labored breathing room air. Clear to auscultation bilaterally.  CV: Regular rate and rhythm. No murmur, rub, or gallop. No JVD, no pedal edema. GI: Abdomen soft, non-tender, non-distended, with normoactive bowel sounds. No organomegaly or masses felt. Ext: Warm, no deformities Skin: Forehead with healing scab, bilateral infraorbital ecchymoses. No rashes, lesions or ulcers Neuro: Alert and oriented. No focal neurological deficits. Psych: Judgement and insight appear normal. Mood & affect appropriate.   Data Reviewed: I have personally reviewed following labs and imaging studies  CBC: Recent Labs  Lab 11/21/19 1547 11/22/19 0355  WBC 7.4 4.9  NEUTROABS 4.3  --   HGB 15.6* 14.0  HCT 48.2* 43.8  MCV 97.6 100.7*  PLT 172 162   Basic Metabolic Panel: Recent Labs  Lab 11/21/19 1547 11/22/19 0355  NA 142 141  K 3.8 4.2  CL 105 110  CO2 26 23  GLUCOSE 91 90  BUN 28* 19  CREATININE 1.03* 0.73  CALCIUM 9.2 8.4*  MG  --  2.1   GFR: Estimated Creatinine Clearance: 57.9 mL/min (by C-G formula based on SCr of 0.73 mg/dL). Liver Function Tests: Recent Labs  Lab 11/22/19 0355  AST 19  ALT 12  ALKPHOS 42  BILITOT 0.8  PROT 6.1*  ALBUMIN 2.9*   No results for input(s): LIPASE, AMYLASE in the last 168 hours. No results for input(s): AMMONIA in the last 168 hours. Coagulation Profile: Recent Labs  Lab 11/21/19 1547  INR 1.0   Cardiac Enzymes: No results for input(s): CKTOTAL, CKMB, CKMBINDEX, TROPONINI in the last 168 hours. BNP (last 3 results) No results for input(s): PROBNP in the last 8760 hours. HbA1C: No results for input(s): HGBA1C in the last 72 hours. CBG: No results for input(s): GLUCAP in the last 168 hours. Lipid Profile: No results for input(s): CHOL, HDL, LDLCALC, TRIG, CHOLHDL, LDLDIRECT in the last 72 hours. Thyroid Function  Tests: Recent Labs    11/22/19 0355  TSH 2.448  FREET4 0.85   Anemia Panel: No results for input(s): VITAMINB12, FOLATE, FERRITIN, TIBC, IRON, RETICCTPCT in the last 72 hours. Urine analysis:    Component Value Date/Time   COLORURINE YELLOW 02/05/2015 0510   APPEARANCEUR CLEAR 02/05/2015 0510   LABSPEC 1.007 02/05/2015 0510   PHURINE 6.0 02/05/2015 0510   GLUCOSEU NEGATIVE 02/05/2015 0510   HGBUR NEGATIVE 02/05/2015 0510   BILIRUBINUR NEGATIVE 02/05/2015 0510   KETONESUR NEGATIVE 02/05/2015 0510   PROTEINUR NEGATIVE 02/05/2015 0510   UROBILINOGEN 0.2 02/05/2015 0510   NITRITE NEGATIVE 02/05/2015 0510   LEUKOCYTESUR NEGATIVE 02/05/2015 0510   Recent Results (from the past 240 hour(s))  SARS CORONAVIRUS 2 (TAT 6-24 HRS) Nasopharyngeal Nasopharyngeal Swab     Status: None   Collection Time: 11/21/19  7:28 PM   Specimen: Nasopharyngeal Swab  Result Value Ref Range Status   SARS Coronavirus 2 NEGATIVE NEGATIVE Final    Comment: (NOTE) SARS-CoV-2 target nucleic acids are NOT DETECTED. The SARS-CoV-2 RNA is generally detectable  in upper and lower respiratory specimens during the acute phase of infection. Negative results do not preclude SARS-CoV-2 infection, do not rule out co-infections with other pathogens, and should not be used as the sole basis for treatment or other patient management decisions. Negative results must be combined with clinical observations, patient history, and epidemiological information. The expected result is Negative. Fact Sheet for Patients: HairSlick.nohttps://www.fda.gov/media/138098/download Fact Sheet for Healthcare Providers: quierodirigir.comhttps://www.fda.gov/media/138095/download This test is not yet approved or cleared by the [place] FDA and  has been authorized for detection and/or diagnosis of SARS-CoV-2 by FDA under an Emergency Use Authorization (EUA). This EUA will remain  in effect (meaning this test can be used) for the duration of the COVID-19  declaration under Section 56 4(b)(1) of the Act, 21 U.S.C. section 360bbb-3(b)(1), unless the authorization is terminated or revoked sooner. Performed at [facility], [address] [place], [place] [address]       Radiology Studies: DG Chest 2 View  Result Date: 11/21/2019 CLINICAL DATA:  Fall, pain EXAM: CHEST - 2 VIEW COMPARISON:  Feb 04, 2015 FINDINGS: The heart size and mediastinal contours are within normal limits. Aortic knob calcifications. Both lungs are clear. The visualized skeletal structures are unremarkable. IMPRESSION: No active cardiopulmonary disease. Electronically Signed   By: Jonna ClarkBindu  Avutu M.D.   On: 11/21/2019 16:55   DG Scapula Right  Result Date: 11/21/2019 CLINICAL DATA:  Fall, pain EXAM: RIGHT SCAPULA - 2+ VIEWS COMPARISON:  Feb 04, 2016 FINDINGS: There is no evidence of fracture or other focal bone lesions. Healed fracture deformity of the proximal humerus. Soft tissues are unremarkable. IMPRESSION: Negative. Electronically Signed   By: Jonna ClarkBindu  Avutu M.D.   On: 11/21/2019 16:59   DG Shoulder Right  Result Date: 11/21/2019 CLINICAL DATA:  Fall EXAM: RIGHT SHOULDER - 2+ VIEW COMPARISON:  Feb 04, 2015 FINDINGS: There is a healed fracture deformity of the right humerus. Overlying surgical anchors. Well corticated ossicle overlying the lateral humeral head. No acute fracture or dislocation. AC joint arthrosis. IMPRESSION: No acute osseous abnormality. Electronically Signed   By: Jonna ClarkBindu  Avutu M.D.   On: 11/21/2019 16:58   CT Head Wo Contrast  Result Date: 11/21/2019 CLINICAL DATA:  Head trauma, minor. Facial trauma. Poly trauma, critical, head/cervical spine injury suspected. Additional history provided: 86 year old female via EMS from home status post trip and fall yesterday at 5 p.m. while walking down outdoor steps, abrasions to face and left hand from hitting concrete EXAM: CT HEAD WITHOUT CONTRAST CT MAXILLOFACIAL WITHOUT CONTRAST CT CERVICAL SPINE WITHOUT CONTRAST  TECHNIQUE: Contiguous axial images were obtained from the base of the skull through the vertex without intravenous contrast. Multidetector CT imaging of the maxillofacial structures was performed. Multiplanar CT image reconstructions were also generated. A small metallic BB was placed on the right temple in order to reliably differentiate right from left. Multidetector CT imaging of the cervical spine was performed without intravenous contrast. Multiplanar CT image reconstructions were also generated. COMPARISON:  CT head/cervical spine 02/04/2015 FINDINGS: CT HEAD FINDINGS Brain: No evidence of acute intracranial hemorrhage. No demarcated cortical infarction. No evidence of intracranial mass. No midline shift or extra-axial fluid collection. Stable, mild generalized parenchymal atrophy. Vascular: No hyperdense vessel. Skull: Normal. Negative for fracture or focal lesion. CT MAXILLOFACIAL FINDINGS Osseous: No evidence of acute maxillofacial fracture. Orbits: No acute abnormality. Sinuses: Moderate/severe mucosal thickening within the right maxillary sinus. Associated hyperdensity within the right maxillary sinus, which may reflect inspissated secretions or sequela of chronic  fungal sinusitis. There is a small defect within the floor of the right maxillary sinus (series 9, image 21). There is also moderate mucosal thickening within the inferior left maxillary sinus. There is prominent periapical lucency surrounding the adjacent posterior left maxillary molar with cortical breakthrough (series 10, image 46) and the possibility of odontogenic sinusitis is raised. Additional mild mucosal thickening within the bilateral frontal and ethmoid air cells. Soft tissues: Mild soft tissue swelling along the forehead. CT CERVICAL SPINE FINDINGS Alignment: Straightening of the expected cervical lordosis. Trace C4-C5 and C5-C6 anterolisthesis. Skull base and vertebrae: The basion-dental and atlanto-dental intervals are  maintained.No evidence of acute fracture to the cervical spine. Soft tissues and spinal canal: No prevertebral fluid or swelling. No visible canal hematoma. Calcified plaque within the carotid arteries. Disc levels: Cervical spondylosis with multilevel posterior disc osteophytes, uncovertebral and facet hypertrophy. No high-grade bony spinal canal stenosis. Multilevel neural foraminal narrowing greatest on the right at C3-C4, bilaterally at C5-C6 and bilaterally at C6-C7. Upper chest: No consolidation within the imaged lung apices. No visible pneumothorax. IMPRESSION: CT head: 1. No evidence of acute intracranial abnormality. 2. Stable, mild generalized parenchymal atrophy. CT maxillofacial: 1. No evidence of acute maxillofacial fracture. 2. Mild forehead soft tissue swelling. 3. Paranasal sinus disease as described. Most notably, there is more moderate/severe mucosal thickening within the right maxillary sinus. There is also hyperdensity within the right maxillary sinus which may reflect inspissated secretions or sequela of chronic fungal sinusitis. A small defect within the floor of the right maxillary may reflect an oro-antral fistula. Also of note, adjacent to the mucosal thickening within the inferior left maxillary sinus there is prominent periapical lucency surrounding the posterior left maxillary molar with cortical breakthrough. This raises the possibility of odontogenic sinusitis. CT cervical spine: 1. No evidence of acute fracture to the cervical spine. 2. Cervical spondylosis as described. Electronically Signed   By: Jackey Loge DO   On: 11/21/2019 17:58   CT Cervical Spine Wo Contrast  Result Date: 11/21/2019 CLINICAL DATA:  Head trauma, minor. Facial trauma. Poly trauma, critical, head/cervical spine injury suspected. Additional history provided: 85 year old female via EMS from home status post trip and fall yesterday at 5 p.m. while walking down outdoor steps, abrasions to face and left hand from  hitting concrete EXAM: CT HEAD WITHOUT CONTRAST CT MAXILLOFACIAL WITHOUT CONTRAST CT CERVICAL SPINE WITHOUT CONTRAST TECHNIQUE: Contiguous axial images were obtained from the base of the skull through the vertex without intravenous contrast. Multidetector CT imaging of the maxillofacial structures was performed. Multiplanar CT image reconstructions were also generated. A small metallic BB was placed on the right temple in order to reliably differentiate right from left. Multidetector CT imaging of the cervical spine was performed without intravenous contrast. Multiplanar CT image reconstructions were also generated. COMPARISON:  CT head/cervical spine 02/04/2015 FINDINGS: CT HEAD FINDINGS Brain: No evidence of acute intracranial hemorrhage. No demarcated cortical infarction. No evidence of intracranial mass. No midline shift or extra-axial fluid collection. Stable, mild generalized parenchymal atrophy. Vascular: No hyperdense vessel. Skull: Normal. Negative for fracture or focal lesion. CT MAXILLOFACIAL FINDINGS Osseous: No evidence of acute maxillofacial fracture. Orbits: No acute abnormality. Sinuses: Moderate/severe mucosal thickening within the right maxillary sinus. Associated hyperdensity within the right maxillary sinus, which may reflect inspissated secretions or sequela of chronic fungal sinusitis. There is a small defect within the floor of the right maxillary sinus (series 9, image 21). There is also moderate mucosal thickening within the inferior left maxillary sinus. There  is prominent periapical lucency surrounding the adjacent posterior left maxillary molar with cortical breakthrough (series 10, image 46) and the possibility of odontogenic sinusitis is raised. Additional mild mucosal thickening within the bilateral frontal and ethmoid air cells. Soft tissues: Mild soft tissue swelling along the forehead. CT CERVICAL SPINE FINDINGS Alignment: Straightening of the expected cervical lordosis. Trace C4-C5  and C5-C6 anterolisthesis. Skull base and vertebrae: The basion-dental and atlanto-dental intervals are maintained.No evidence of acute fracture to the cervical spine. Soft tissues and spinal canal: No prevertebral fluid or swelling. No visible canal hematoma. Calcified plaque within the carotid arteries. Disc levels: Cervical spondylosis with multilevel posterior disc osteophytes, uncovertebral and facet hypertrophy. No high-grade bony spinal canal stenosis. Multilevel neural foraminal narrowing greatest on the right at C3-C4, bilaterally at C5-C6 and bilaterally at C6-C7. Upper chest: No consolidation within the imaged lung apices. No visible pneumothorax. IMPRESSION: CT head: 1. No evidence of acute intracranial abnormality. 2. Stable, mild generalized parenchymal atrophy. CT maxillofacial: 1. No evidence of acute maxillofacial fracture. 2. Mild forehead soft tissue swelling. 3. Paranasal sinus disease as described. Most notably, there is more moderate/severe mucosal thickening within the right maxillary sinus. There is also hyperdensity within the right maxillary sinus which may reflect inspissated secretions or sequela of chronic fungal sinusitis. A small defect within the floor of the right maxillary may reflect an oro-antral fistula. Also of note, adjacent to the mucosal thickening within the inferior left maxillary sinus there is prominent periapical lucency surrounding the posterior left maxillary molar with cortical breakthrough. This raises the possibility of odontogenic sinusitis. CT cervical spine: 1. No evidence of acute fracture to the cervical spine. 2. Cervical spondylosis as described. Electronically Signed   By: Jackey Loge DO   On: 11/21/2019 17:58   DG Knee Complete 4 Views Left  Result Date: 11/21/2019 CLINICAL DATA:  Fall with knee pain EXAM: LEFT KNEE - COMPLETE 4+ VIEW COMPARISON:  02/04/2015 FINDINGS: No definitive fracture or malalignment. Mild patellofemoral and medial joint space  degenerative change. No significant knee effusion. IMPRESSION: Mild degenerative changes. No acute osseous abnormality. No definite acute osseous abnormality. Electronically Signed   By: Jasmine Pang M.D.   On: 11/21/2019 17:07   DG Knee Complete 4 Views Right  Result Date: 11/21/2019 CLINICAL DATA:  Fall and pain EXAM: RIGHT KNEE - COMPLETE 4+ VIEW COMPARISON:  None. FINDINGS: There is diffuse osteopenia. No definite fracture or dislocation. A healed fracture deformity of the proximal fibula is noted. No large knee joint effusion. IMPRESSION: No definite acute osseous abnormality. Electronically Signed   By: Jonna Clark M.D.   On: 11/21/2019 17:04   DG Hand Complete Left  Result Date: 11/21/2019 CLINICAL DATA:  Fall, pain EXAM: LEFT HAND - COMPLETE 3+ VIEW COMPARISON:  None. FINDINGS: There is no evidence of fracture or dislocation. There is diffuse osteopenia. 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 Soft tissues are unremarkable. IMPRESSION: No acute osseous abnormality. Electronically Signed   By: Jonna Clark M.D.   On: 11/21/2019 17:00   DG Hand Complete Right  Result Date: 11/21/2019 CLINICAL DATA:  Fall, pain EXAM: RIGHT HAND - COMPLETE 3+ VIEW COMPARISON:  None. FINDINGS: There is no evidence of fracture or dislocation. Osteoarthritis at the first [facility] joint and the IP joints. There is diffuse osteopenia. Soft tissues are unremarkable. IMPRESSION: No acute osseous abnormality. Electronically Signed   By: Jonna Clark M.D.   On: 11/21/2019 17:00   ECHOCARDIOGRAM COMPLETE  Result Date: 11/22/2019    ECHOCARDIOGRAM REPORT   Patient Name:   Angela Leach Date of Exam: 11/22/2019 Medical Rec #:  [id number]      Height:       65.0 in Accession #:    [id number]     Weight:       198.0 lb Date of Birth:  Mar 15, 1933      BSA:          1.970 m Patient Age:    86 years       BP:           113/60 mmHg Patient Gender: F              HR:           64 bpm. Exam Location:  Inpatient  Procedure: 2D Echo Indications:    atrial fibrillation 427.31  History:        Patient has no prior history of Echocardiogram examinations. No                 prior cardiac hx on file.  Sonographer:    Celene Skeen RDCS (AE) Referring Phys: [id number] TIMOTHY S OPYD  Sonographer Comments: Suboptimal parasternal window. Image acquisition challenging due to patient body habitus. restricted mobility IMPRESSIONS  1. Left ventricular ejection fraction, by estimation, is 55 to 60%. The left ventricle has normal function. The left ventricle has no regional wall motion abnormalities. Left ventricular diastolic parameters are consistent with age-related delayed relaxation (normal).  2. Right ventricular systolic function is normal. The right ventricular size is normal. There is normal pulmonary artery systolic pressure. The estimated right ventricular systolic pressure is 23.7 mmHg.  3. The mitral valve is degenerative. No evidence of mitral valve regurgitation. No evidence of mitral stenosis.  4. The aortic valve is tricuspid. Aortic valve regurgitation is not visualized. No aortic stenosis is present.  5. The inferior vena cava is normal in size with <50% respiratory variability, suggesting right atrial pressure of 8 mmHg. FINDINGS  Left Ventricle: Left ventricular ejection fraction, by estimation, is 55 to 60%. The left ventricle has normal function. The left ventricle has no regional wall motion abnormalities. The left ventricular internal cavity size was normal in size. There is  no left ventricular hypertrophy. Left ventricular diastolic parameters are consistent with age-related delayed relaxation (normal). Right Ventricle: The right ventricular size is normal. No increase in right ventricular wall thickness. Right ventricular systolic function is normal. There is normal pulmonary artery systolic pressure. The tricuspid regurgitant velocity is 1.98 m/s, and  with an assumed right atrial pressure of 8 mmHg, the estimated  right ventricular systolic pressure is 23.7 mmHg. Left Atrium: Left atrial size was normal in size. Right Atrium: Right atrial size was normal in size. Pericardium: There is no evidence of pericardial effusion. Mitral Valve: The mitral valve is degenerative in appearance. Mild mitral annular calcification. No evidence of mitral valve regurgitation. No evidence of mitral valve stenosis. Tricuspid Valve: The tricuspid valve is grossly normal. Tricuspid valve regurgitation is trivial. Aortic Valve: The aortic valve is tricuspid. Aortic valve regurgitation is not visualized. No aortic stenosis is present. Pulmonic Valve: The pulmonic valve was grossly normal. Pulmonic valve regurgitation is not visualized. Aorta: The aortic root is normal in size and structure. Venous: The inferior vena cava is normal in size with less than 50% respiratory variability, suggesting right atrial pressure of 8 mmHg. IAS/Shunts: No atrial level shunt detected by color flow Doppler.  LEFT  VENTRICLE PLAX 2D LVIDd:         4.00 cm  Diastology LVIDs:         2.50 cm  LV e' lateral:   13.40 cm/s LV PW:         1.20 cm  LV E/e' lateral: 6.7 LV IVS:        1.00 cm  LV e' medial:    7.51 cm/s LVOT diam:     1.80 cm  LV E/e' medial:  12.0 LV SV:         45 LV SV Index:   23 LVOT Area:     2.54 cm  LEFT ATRIUM           Index LA diam:      3.70 cm 1.88 cm/m LA Vol (A2C): 67.5 ml 34.27 ml/m LA Vol (A4C): 51.6 ml 26.20 ml/m  AORTIC VALVE LVOT Vmax:   86.80 cm/s LVOT Vmean:  55.600 cm/s LVOT VTI:    0.175 m  AORTA Ao Root diam: 2.30 cm MITRAL VALVE                TRICUSPID VALVE MV Area (PHT): 3.99 cm     TR Peak grad:   15.7 mmHg MV Decel Time: 190 msec     TR Vmax:        198.18 cm/s MV E velocity: 90.40 cm/s MV A velocity: 107.00 cm/s  SHUNTS MV E/A ratio:  0.84         Systemic VTI:  0.18 m                             Systemic Diam: 1.80 cm Lennie Odor MD Electronically signed by Lennie Odor MD Signature Date/Time: 11/22/2019/4:40:05 PM     Final    DG Hips Bilat W or Wo Pelvis 3-4 Views  Result Date: 11/21/2019 CLINICAL DATA:  Fall and pain EXAM: DG HIP (WITH OR WITHOUT PELVIS) 3-4V BILAT COMPARISON:  None. FINDINGS: There is no evidence of hip fracture or dislocation, however somewhat limited due to diffuse osteopenia. Degenerative changes in the lower lumbar spine. IMPRESSION: No definite acute osseous abnormality. If there is high clinical suspicion for occult hip fracture or the patient refuses to weightbear, consider further evaluation with MRI. Although CT is expeditious, evidence is lacking regarding accuracy of CT over plain film radiography. Electronically Signed   By: Jonna Clark M.D.   On: 11/21/2019 17:01   CT Maxillofacial Wo Contrast  Result Date: 11/21/2019 CLINICAL DATA:  Head trauma, minor. Facial trauma. Poly trauma, critical, head/cervical spine injury suspected. Additional history provided: 86 year old female via EMS from home status post trip and fall yesterday at 5 p.m. while walking down outdoor steps, abrasions to face and left hand from hitting concrete EXAM: CT HEAD WITHOUT CONTRAST CT MAXILLOFACIAL WITHOUT CONTRAST CT CERVICAL SPINE WITHOUT CONTRAST TECHNIQUE: Contiguous axial images were obtained from the base of the skull through the vertex without intravenous contrast. Multidetector CT imaging of the maxillofacial structures was performed. Multiplanar CT image reconstructions were also generated. A small metallic BB was placed on the right temple in order to reliably differentiate right from left. Multidetector CT imaging of the cervical spine was performed without intravenous contrast. Multiplanar CT image reconstructions were also generated. COMPARISON:  CT head/cervical spine 02/04/2015 FINDINGS: CT HEAD FINDINGS Brain: No evidence of acute intracranial hemorrhage. No demarcated cortical infarction. No evidence of intracranial mass. No midline shift or extra-axial  fluid collection. Stable, mild generalized  parenchymal atrophy. Vascular: No hyperdense vessel. Skull: Normal. Negative for fracture or focal lesion. CT MAXILLOFACIAL FINDINGS Osseous: No evidence of acute maxillofacial fracture. Orbits: No acute abnormality. Sinuses: Moderate/severe mucosal thickening within the right maxillary sinus. Associated hyperdensity within the right maxillary sinus, which may reflect inspissated secretions or sequela of chronic fungal sinusitis. There is a small defect within the floor of the right maxillary sinus (series 9, image 21). There is also moderate mucosal thickening within the inferior left maxillary sinus. There is prominent periapical lucency surrounding the adjacent posterior left maxillary molar with cortical breakthrough (series 10, image 46) and the possibility of odontogenic sinusitis is raised. Additional mild mucosal thickening within the bilateral frontal and ethmoid air cells. Soft tissues: Mild soft tissue swelling along the forehead. CT CERVICAL SPINE FINDINGS Alignment: Straightening of the expected cervical lordosis. Trace C4-C5 and C5-C6 anterolisthesis. Skull base and vertebrae: The basion-dental and atlanto-dental intervals are maintained.No evidence of acute fracture to the cervical spine. Soft tissues and spinal canal: No prevertebral fluid or swelling. No visible canal hematoma. Calcified plaque within the carotid arteries. Disc levels: Cervical spondylosis with multilevel posterior disc osteophytes, uncovertebral and facet hypertrophy. No high-grade bony spinal canal stenosis. Multilevel neural foraminal narrowing greatest on the right at C3-C4, bilaterally at C5-C6 and bilaterally at C6-C7. Upper chest: No consolidation within the imaged lung apices. No visible pneumothorax. IMPRESSION: CT head: 1. No evidence of acute intracranial abnormality. 2. Stable, mild generalized parenchymal atrophy. CT maxillofacial: 1. No evidence of acute maxillofacial fracture. 2. Mild forehead soft tissue swelling. 3.  Paranasal sinus disease as described. Most notably, there is more moderate/severe mucosal thickening within the right maxillary sinus. There is also hyperdensity within the right maxillary sinus which may reflect inspissated secretions or sequela of chronic fungal sinusitis. A small defect within the floor of the right maxillary may reflect an oro-antral fistula. Also of note, adjacent to the mucosal thickening within the inferior left maxillary sinus there is prominent periapical lucency surrounding the posterior left maxillary molar with cortical breakthrough. This raises the possibility of odontogenic sinusitis. CT cervical spine: 1. No evidence of acute fracture to the cervical spine. 2. Cervical spondylosis as described. Electronically Signed   By: Kellie Simmering DO   On: 11/21/2019 17:58    Scheduled Meds: . apixaban  5 mg Oral BID   Continuous Infusions:   LOS: 1 day   Time spent: 25 minutes.  Patrecia Pour, MD [facility] [email or web address] 11/22/2019, 5:52 PM

## 2019-11-22 NOTE — Progress Notes (Signed)
  Echocardiogram 2D Echocardiogram has been performed.  Angela Leach 11/22/2019, 2:03 PM

## 2019-11-22 NOTE — TOC Progression Note (Signed)
Transition of Care [facility]) - Progression Note    Patient Details  Name: RILYN SCROGGS MRN: [id number] Date of Birth: 1933-08-17  [facility]) CM/SW Contact  Geni Bers, RN Phone Number: 11/22/2019, 1:47 PM  Clinical Narrative:    Spoke with pt's daughter who states that she will not need HH at discharge. Pt continues that her daughter lives with her and she will help her. CM will sign off.    Expected Discharge Plan: Home/Self Care Barriers to Discharge: No Barriers Identified  Expected Discharge Plan and Services Expected Discharge Plan: Home/Self Care   Discharge Planning Services: CM Consult   Living arrangements for the past 2 months: Single Family Home                                       Social Determinants of Health (SDOH) Interventions    Readmission Risk Interventions No flowsheet data found.

## 2019-11-23 MED ORDER — APIXABAN 5 MG PO TABS: 5.0000 mg | ORAL_TABLET | Freq: Two times a day (BID) | ORAL | 0 refills | Status: DC

## 2019-11-23 NOTE — Progress Notes (Signed)
Physical Therapy Treatment Patient Details Name: Angela Leach MRN: [id number] DOB: 08-04-1933 Today's Date: 11/23/2019    History of Present Illness 86 yo female admitted with A fib with RVR after sustaining a fall at home. X-rays (-). Hx of prior humerus fx, dizziness.    PT Comments    Assisted OOB to [facility] then to amb.  General transfer comment: first assisted from elevated bed to [facility] no AD with one VC on proper hand transfer and turn completion.  Second assist from [facility] to walker for amb. General Gait Details: tolerated diostance well.  No c/o dizziness this session.  Slight forward flex posture and shuffled steps. Pt feeling better, slitt sore from her fall esp neck stiffness, eager to go home today.   Follow Up Recommendations  Home health PT(lives with daughter)     Equipment Recommendations       Recommendations for Other Services       Precautions / Restrictions Precautions Precautions: Fall    Mobility  Bed Mobility Overal bed mobility: Modified Independent             General bed mobility comments: increased time  Transfers Overall transfer level: Needs assistance Equipment used: None Transfers: Sit to/from Stand;Stand Pivot Transfers Sit to Stand: Supervision Stand pivot transfers: Supervision;Min guard       General transfer comment: first assisted from elevated bed to [facility] no AD with one VC on proper hand transfer and turn completion.  Second assist from [facility] to walker for amb.  Ambulation/Gait Ambulation/Gait assistance: Supervision Gait Distance (Feet): 95 Feet Assistive device: Rolling walker (2 wheeled) Gait Pattern/deviations: Step-through pattern;Decreased stride length;Trunk flexed Gait velocity: decreased   General Gait Details: tolerated diostance well.  No c/o dizziness this session.  Slight forward flex posture and shuffled steps.   Stairs             Wheelchair Mobility    Modified Rankin (Stroke Patients Only)        Balance                                            Cognition Arousal/Alertness: Awake/alert Behavior During Therapy: WFL for tasks assessed/performed Overall Cognitive Status: Within Functional Limits for tasks assessed                                 General Comments: pleasant      Exercises      General Comments        Pertinent Vitals/Pain Pain Assessment: Faces Faces Pain Scale: Hurts a little bit Pain Location: general Pain Descriptors / Indicators: Discomfort    Home Living                      Prior Function            PT Goals (current goals can now be found in the care plan section) Progress towards PT goals: Progressing toward goals    Frequency    Min 3X/week      PT Plan Current plan remains appropriate    Co-evaluation              AM-PAC PT "6 Clicks" Mobility   Outcome Measure  Help needed turning from your back to your side while in a flat bed without using  bedrails?: None Help needed moving from lying on your back to sitting on the side of a flat bed without using bedrails?: None Help needed moving to and from a bed to a chair (including a wheelchair)?: A Little Help needed standing up from a chair using your arms (e.g., wheelchair or bedside chair)?: A Little Help needed to walk in hospital room?: A Little Help needed climbing 3-5 steps with a railing? : A Little 6 Click Score: 20    End of Session Equipment Utilized During Treatment: Gait belt Activity Tolerance: Patient tolerated treatment well Patient left: in chair;with call bell/phone within reach;with nursing/sitter in room Nurse Communication: Mobility status PT Visit Diagnosis: Unsteadiness on feet (R26.81);History of falling (Z91.81);Difficulty in walking, not elsewhere classified (R26.2);Muscle weakness (generalized) (M62.81)     Time: 1610-9604 PT Time Calculation (min) (ACUTE ONLY): 28 min  Charges:  $Gait Training: 8-22  mins $Therapeutic Activity: 8-22 mins                     Felecia Shelling  PTA Acute  Rehabilitation Services Pager      [phone] Office      [phone]

## 2019-11-23 NOTE — Plan of Care (Signed)
  Problem: Education: Goal: Knowledge of General Education information will improve Description: Including pain rating scale, medication(s)/side effects and non-pharmacologic comfort measures 11/23/2019 1100 by Charmian Muff, RN Outcome: Adequate for Discharge 11/23/2019 1059 by Charmian Muff, RN Outcome: Progressing   Problem: Health Behavior/Discharge Planning: Goal: Ability to manage health-related needs will improve 11/23/2019 1100 by Charmian Muff, RN Outcome: Adequate for Discharge 11/23/2019 1059 by Charmian Muff, RN Outcome: Progressing   Problem: Clinical Measurements: Goal: Ability to maintain clinical measurements within normal limits will improve Outcome: Adequate for Discharge Goal: Will remain free from infection Outcome: Adequate for Discharge Goal: Diagnostic test results will improve Outcome: Adequate for Discharge Goal: Respiratory complications will improve Outcome: Adequate for Discharge Goal: Cardiovascular complication will be avoided Outcome: Adequate for Discharge

## 2019-11-23 NOTE — Progress Notes (Signed)
Pt discharged to home with family, reviewed instructions with pt. Pt equipment arrived family waited until arrival of equipment for pt to discharge. Acknowledged understanding of d/c instructions. SRP, RN

## 2019-11-23 NOTE — TOC Progression Note (Signed)
Transition of Care [facility]) - Progression Note    Patient Details  Name: Angela Leach MRN: [id number] Date of Birth: 05/17/1933  Transition of Care [facility]) CM/SW Contact  Geni Bers, RN Phone Number: 11/23/2019, 1:13 PM  Clinical Narrative:    DME ordered from [employer].    Expected Discharge Plan: Home/Self Care Barriers to Discharge: No Barriers Identified  Expected Discharge Plan and Services Expected Discharge Plan: Home/Self Care   Discharge Planning Services: CM Consult   Living arrangements for the past 2 months: Single Family Home Expected Discharge Date: 11/23/19                                     Social Determinants of Health (SDOH) Interventions    Readmission Risk Interventions No flowsheet data found.

## 2019-11-23 NOTE — Plan of Care (Signed)
  Problem: Education: Goal: Knowledge of General Education information will improve Description Including pain rating scale, medication(s)/side effects and non-pharmacologic comfort measures Outcome: Progressing   Problem: Health Behavior/Discharge Planning: Goal: Ability to manage health-related needs will improve Outcome: Progressing   

## 2019-11-23 NOTE — Discharge Summary (Signed)
Physician Discharge Summary  Angela Leach QIO:[id number] DOB: 02-01-1933 DOA: 11/21/2019  PCP: Clovis Riley, L.August Saucer, MD  Admit date: 11/21/2019 Discharge date: 11/23/2019  Admitted From: Home Disposition: Home   Recommendations for Outpatient Follow-up:  1. Follow up with PCP in 1-2 weeks, consider cardiology evaluation. Started eliquis for new onset AFib after discussion of bleeding risks and benefit of reduced thromboembolic risk. 2. Please obtain BMP/CBC in one week  Home Health: PT, OT Equipment/Devices: None new Discharge Condition: Stable CODE STATUS: Full Diet recommendation: Heart healthy  Brief/Interim Summary: Angela Leach is a healthy 86 y.o. female who presented to the ED 2/23 after a fall down stairs at home. She put only half a foot on the 2nd to last stair and fell down striking her head and outstretched arms with immediate pain and bleeding on forehead. She denies syncopal symptoms. In the ED she was found to be tachycardic with newly discovered atrial fibrillation. Radiographs of the hip, hands, right shoulder, knees, and right scapula are negative for definite acute osseous abnormalities and chest x-ray is also negative for acute findings. CT head is negative for acute intracranial abnormality, no acute fracture noted on cervical spine CT, and maxillofacial CT is notable for sinus disease. Patient was given IV fluids and analgesia in the emergency department with spontaneous resolution of rapid atrial fibrillation. She remained unsteady with ambulation and admission was requested. Eliquis was started. The next 24 hours, the patient continued to have sinus rhythm without AFib and RVR, a single asymptomatic run of 3 beats SVT also noted.   Discharge Diagnoses:  Principal Problem:   Atrial fibrillation with RVR (HCC) Active Problems:   Fall at home, initial encounter   Mild renal insufficiency   Contusion of face  New onset atrial fibrillation: Pt at risk of hemorrhage due  to falls but also stroke due to AFib. Note atria not enlarged on echocardiogram. Unclear duration, though has had palpitations for a few months prior to admission. TSH and T4 wnl.  - Continue eliquis due to age and gender. Given her obvious risk of falls, anticoagulation could be reconsidered based on ongoing fall risk. - Rate is controlled/converted rhythm at this time, so not starting rate-control or rhythm control medications currently.   Fall at home:  - PT/OT recommend home health which is ordered.  Forehead laceration, multiple sites of trauma: Without acute fracture discovered.  - Tylenol and tramadol prn pain. Not a true allergy to tylenol, and would strongly prefer to avoid NSAIDs w/AKI and now on anticoagulation.   AKI: Improved craetinine from 1.03 > 0.73.  - Ok to use DOAC. Avoid nephrotoxins.   Discharge Instructions Discharge Instructions    Call MD for:  difficulty breathing, headache or visual disturbances   Complete by: As directed    Call MD for:  severe uncontrolled pain   Complete by: As directed    Call MD for:  temperature >100.4   Complete by: As directed    Diet - low sodium heart healthy   Complete by: As directed    Discharge instructions   Complete by: As directed    Call your doctor to follow up next week with repeat labs and to discuss atrial fibrillation. Until that time you should not climb ladders or be at any height which could be dangerous were you to fall. Continue advancing mobility as tolerated with home health PT and OT.   Eliquis has been sent to your pharmacy which is a blood thinner to reduce  your risk of stroke (which is higher due to AFib). This can make bleeding more likely. You will need to report any uncontrolled bleeding immediately.   Increase activity slowly   Complete by: As directed      Allergies as of 11/23/2019      Reactions   Penicillins    Did it involve swelling of the face/tongue/throat, SOB, or low BP? Y Did it involve  sudden or severe rash/hives, skin peeling, or any reaction on the inside of your mouth or nose? Y Did you need to seek medical attention at a hospital or doctor's office? N When did it last happen?Over 30 years If all above answers are "NO", may proceed with cephalosporin use.      Medication List    STOP taking these medications   HYDROcodone-acetaminophen 5-325 MG tablet Commonly known as: NORCO/VICODIN   ondansetron 4 MG tablet Commonly known as: ZOFRAN   senna-docusate 8.6-50 MG tablet Commonly known as: Senokot-S     TAKE these medications   apixaban 5 MG Tabs tablet Commonly known as: ELIQUIS Take 1 tablet (5 mg total) by mouth 2 (two) times daily.   CALCIUM-VITAMIN D PO Take 1 tablet by mouth every evening.   Multivitamin Adults Tabs Take 1 tablet by mouth daily.   WHITE WILLOW BARK PO Take 1 tablet by mouth daily.      Follow-up Information    Clovis Riley, L.August Saucer, MD. Schedule an appointment as soon as possible for a visit in 1 week(s).   Specialty: Family Medicine Contact information: [address] [place] [address] [phone]          Allergies  Allergen Reactions  . Penicillins     Did it involve swelling of the face/tongue/throat, SOB, or low BP? Y Did it involve sudden or severe rash/hives, skin peeling, or any reaction on the inside of your mouth or nose? Y Did you need to seek medical attention at a hospital or doctor's office? N When did it last happen?Over 30 years If all above answers are "NO", may proceed with cephalosporin use.     Consultations:  None  Procedures/Studies: DG Chest 2 View  Result Date: 11/21/2019 CLINICAL DATA:  Fall, pain EXAM: CHEST - 2 VIEW COMPARISON:  Feb 04, 2015 FINDINGS: The heart size and mediastinal contours are within normal limits. Aortic knob calcifications. Both lungs are clear. The visualized skeletal structures are unremarkable. IMPRESSION: No active cardiopulmonary disease.  Electronically Signed   By: Jonna Clark M.D.   On: 11/21/2019 16:55   DG Scapula Right  Result Date: 11/21/2019 CLINICAL DATA:  Fall, pain EXAM: RIGHT SCAPULA - 2+ VIEWS COMPARISON:  Feb 04, 2016 FINDINGS: There is no evidence of fracture or other focal bone lesions. Healed fracture deformity of the proximal humerus. Soft tissues are unremarkable. IMPRESSION: Negative. Electronically Signed   By: Jonna Clark M.D.   On: 11/21/2019 16:59   DG Shoulder Right  Result Date: 11/21/2019 CLINICAL DATA:  Fall EXAM: RIGHT SHOULDER - 2+ VIEW COMPARISON:  Feb 04, 2015 FINDINGS: There is a healed fracture deformity of the right humerus. Overlying surgical anchors. Well corticated ossicle overlying the lateral humeral head. No acute fracture or dislocation. AC joint arthrosis. IMPRESSION: No acute osseous abnormality. Electronically Signed   By: Jonna Clark M.D.   On: 11/21/2019 16:58   CT Head Wo Contrast  Result Date: 11/21/2019 CLINICAL DATA:  Head trauma, minor. Facial trauma. Poly trauma, critical, head/cervical spine injury suspected. Additional history  provided: 86 year old female via EMS from home status post trip and fall yesterday at 5 p.m. while walking down outdoor steps, abrasions to face and left hand from hitting concrete EXAM: CT HEAD WITHOUT CONTRAST CT MAXILLOFACIAL WITHOUT CONTRAST CT CERVICAL SPINE WITHOUT CONTRAST TECHNIQUE: Contiguous axial images were obtained from the base of the skull through the vertex without intravenous contrast. Multidetector CT imaging of the maxillofacial structures was performed. Multiplanar CT image reconstructions were also generated. A small metallic BB was placed on the right temple in order to reliably differentiate right from left. Multidetector CT imaging of the cervical spine was performed without intravenous contrast. Multiplanar CT image reconstructions were also generated. COMPARISON:  CT head/cervical spine 02/04/2015 FINDINGS: CT HEAD FINDINGS Brain: No  evidence of acute intracranial hemorrhage. No demarcated cortical infarction. No evidence of intracranial mass. No midline shift or extra-axial fluid collection. Stable, mild generalized parenchymal atrophy. Vascular: No hyperdense vessel. Skull: Normal. Negative for fracture or focal lesion. CT MAXILLOFACIAL FINDINGS Osseous: No evidence of acute maxillofacial fracture. Orbits: No acute abnormality. Sinuses: Moderate/severe mucosal thickening within the right maxillary sinus. Associated hyperdensity within the right maxillary sinus, which may reflect inspissated secretions or sequela of chronic fungal sinusitis. There is a small defect within the floor of the right maxillary sinus (series 9, image 21). There is also moderate mucosal thickening within the inferior left maxillary sinus. There is prominent periapical lucency surrounding the adjacent posterior left maxillary molar with cortical breakthrough (series 10, image 46) and the possibility of odontogenic sinusitis is raised. Additional mild mucosal thickening within the bilateral frontal and ethmoid air cells. Soft tissues: Mild soft tissue swelling along the forehead. CT CERVICAL SPINE FINDINGS Alignment: Straightening of the expected cervical lordosis. Trace C4-C5 and C5-C6 anterolisthesis. Skull base and vertebrae: The basion-dental and atlanto-dental intervals are maintained.No evidence of acute fracture to the cervical spine. Soft tissues and spinal canal: No prevertebral fluid or swelling. No visible canal hematoma. Calcified plaque within the carotid arteries. Disc levels: Cervical spondylosis with multilevel posterior disc osteophytes, uncovertebral and facet hypertrophy. No high-grade bony spinal canal stenosis. Multilevel neural foraminal narrowing greatest on the right at C3-C4, bilaterally at C5-C6 and bilaterally at C6-C7. Upper chest: No consolidation within the imaged lung apices. No visible pneumothorax. IMPRESSION: CT head: 1. No evidence of  acute intracranial abnormality. 2. Stable, mild generalized parenchymal atrophy. CT maxillofacial: 1. No evidence of acute maxillofacial fracture. 2. Mild forehead soft tissue swelling. 3. Paranasal sinus disease as described. Most notably, there is more moderate/severe mucosal thickening within the right maxillary sinus. There is also hyperdensity within the right maxillary sinus which may reflect inspissated secretions or sequela of chronic fungal sinusitis. A small defect within the floor of the right maxillary may reflect an oro-antral fistula. Also of note, adjacent to the mucosal thickening within the inferior left maxillary sinus there is prominent periapical lucency surrounding the posterior left maxillary molar with cortical breakthrough. This raises the possibility of odontogenic sinusitis. CT cervical spine: 1. No evidence of acute fracture to the cervical spine. 2. Cervical spondylosis as described. Electronically Signed   By: Jackey Loge DO   On: 11/21/2019 17:58   CT Cervical Spine Wo Contrast  Result Date: 11/21/2019 CLINICAL DATA:  Head trauma, minor. Facial trauma. Poly trauma, critical, head/cervical spine injury suspected. Additional history provided: 86 year old female via EMS from home status post trip and fall yesterday at 5 p.m. while walking down outdoor steps, abrasions to face and left hand from hitting concrete EXAM: CT  HEAD WITHOUT CONTRAST CT MAXILLOFACIAL WITHOUT CONTRAST CT CERVICAL SPINE WITHOUT CONTRAST TECHNIQUE: Contiguous axial images were obtained from the base of the skull through the vertex without intravenous contrast. Multidetector CT imaging of the maxillofacial structures was performed. Multiplanar CT image reconstructions were also generated. A small metallic BB was placed on the right temple in order to reliably differentiate right from left. Multidetector CT imaging of the cervical spine was performed without intravenous contrast. Multiplanar CT image  reconstructions were also generated. COMPARISON:  CT head/cervical spine 02/04/2015 FINDINGS: CT HEAD FINDINGS Brain: No evidence of acute intracranial hemorrhage. No demarcated cortical infarction. No evidence of intracranial mass. No midline shift or extra-axial fluid collection. Stable, mild generalized parenchymal atrophy. Vascular: No hyperdense vessel. Skull: Normal. Negative for fracture or focal lesion. CT MAXILLOFACIAL FINDINGS Osseous: No evidence of acute maxillofacial fracture. Orbits: No acute abnormality. Sinuses: Moderate/severe mucosal thickening within the right maxillary sinus. Associated hyperdensity within the right maxillary sinus, which may reflect inspissated secretions or sequela of chronic fungal sinusitis. There is a small defect within the floor of the right maxillary sinus (series 9, image 21). There is also moderate mucosal thickening within the inferior left maxillary sinus. There is prominent periapical lucency surrounding the adjacent posterior left maxillary molar with cortical breakthrough (series 10, image 46) and the possibility of odontogenic sinusitis is raised. Additional mild mucosal thickening within the bilateral frontal and ethmoid air cells. Soft tissues: Mild soft tissue swelling along the forehead. CT CERVICAL SPINE FINDINGS Alignment: Straightening of the expected cervical lordosis. Trace C4-C5 and C5-C6 anterolisthesis. Skull base and vertebrae: The basion-dental and atlanto-dental intervals are maintained.No evidence of acute fracture to the cervical spine. Soft tissues and spinal canal: No prevertebral fluid or swelling. No visible canal hematoma. Calcified plaque within the carotid arteries. Disc levels: Cervical spondylosis with multilevel posterior disc osteophytes, uncovertebral and facet hypertrophy. No high-grade bony spinal canal stenosis. Multilevel neural foraminal narrowing greatest on the right at C3-C4, bilaterally at C5-C6 and bilaterally at C6-C7. Upper  chest: No consolidation within the imaged lung apices. No visible pneumothorax. IMPRESSION: CT head: 1. No evidence of acute intracranial abnormality. 2. Stable, mild generalized parenchymal atrophy. CT maxillofacial: 1. No evidence of acute maxillofacial fracture. 2. Mild forehead soft tissue swelling. 3. Paranasal sinus disease as described. Most notably, there is more moderate/severe mucosal thickening within the right maxillary sinus. There is also hyperdensity within the right maxillary sinus which may reflect inspissated secretions or sequela of chronic fungal sinusitis. A small defect within the floor of the right maxillary may reflect an oro-antral fistula. Also of note, adjacent to the mucosal thickening within the inferior left maxillary sinus there is prominent periapical lucency surrounding the posterior left maxillary molar with cortical breakthrough. This raises the possibility of odontogenic sinusitis. CT cervical spine: 1. No evidence of acute fracture to the cervical spine. 2. Cervical spondylosis as described. Electronically Signed   By: Jackey LogeKyle  Golden DO   On: 11/21/2019 17:58   DG Knee Complete 4 Views Left  Result Date: 11/21/2019 CLINICAL DATA:  Fall with knee pain EXAM: LEFT KNEE - COMPLETE 4+ VIEW COMPARISON:  02/04/2015 FINDINGS: No definitive fracture or malalignment. Mild patellofemoral and medial joint space degenerative change. No significant knee effusion. IMPRESSION: Mild degenerative changes. No acute osseous abnormality. No definite acute osseous abnormality. Electronically Signed   By: Jasmine PangKim  Fujinaga M.D.   On: 11/21/2019 17:07   DG Knee Complete 4 Views Right  Result Date: 11/21/2019 CLINICAL DATA:  Fall and pain  EXAM: RIGHT KNEE - COMPLETE 4+ VIEW COMPARISON:  None. FINDINGS: There is diffuse osteopenia. No definite fracture or dislocation. A healed fracture deformity of the proximal fibula is noted. No large knee joint effusion. IMPRESSION: No definite acute osseous  abnormality. Electronically Signed   By: Jonna ClarkBindu  Avutu M.D.   On: 11/21/2019 17:04   DG Hand Complete Left  Result Date: 11/21/2019 CLINICAL DATA:  Fall, pain EXAM: LEFT HAND - COMPLETE 3+ VIEW COMPARISON:  None. FINDINGS: There is no evidence of fracture or dislocation. There is diffuse osteopenia. Oste[facility] and the IP joints. Soft tissues are unremarkable. IMPRESSION: No acute osseous abnormality. Electronically Signed   By: Jonna ClarkBindu  Avutu M.D.   On: 11/21/2019 17:00   DG Hand Complete Right  Result Date: 11/21/2019 CLINICAL DATA:  Fall, pain EXAM: RIGHT HAND - COMPLETE 3+ VIEW COMPARISON:  None. FINDINGS: There is no evidence of fracture or dislocation. Oste[facility] the [facility] joint and the IP joints. There is diffuse osteopenia. Soft tissues are unremarkable. IMPRESSION: No acute osseous abnormality. Electronically Signed   By: Jonna ClarkBindu  Avutu M.D.   On: 11/21/2019 17:00   ECHOCARDIOGRAM COMPLETE  Result Date: 11/22/2019    ECHOCARDIOGRAM REPORT   Patient Name:   Rodney BoozeRLIS M Loh Date of Exam: 11/22/2019 Medical Rec #:  [id number]      Height:       65.0 in Accession #:    [id number]     Weight:       198.0 lb Date of Birth:  04/22/1933      BSA:          1.970 m Patient Age:    86 years       BP:           113/60 mmHg Patient Gender: F              HR:           64 bpm. Exam Location:  Inpatient Procedure: 2D Echo Indications:    atrial fibrillation 427.31  History:        Patient has no prior history of Echocardiogram examinations. No                 prior cardiac hx on file.  Sonographer:    Celene SkeenVijay Shankar RDCS (AE) Referring Phys: [id number] TIMOTHY S OPYD  Sonographer Comments: Suboptimal parasternal window. Image acquisition challenging due to patient body habitus. restricted mobility IMPRESSIONS  1. Left ventricular ejection fraction, by estimation, is 55 to 60%. The left ventricle has normal function. The left ventricle has no regional wall motion abnormalities.  Left ventricular diastolic parameters are consistent with age-related delayed relaxation (normal).  2. Right ventricular systolic function is normal. The right ventricular size is normal. There is normal pulmonary artery systolic pressure. The estimated right ventricular systolic pressure is 23.7 mmHg.  3. The mitral valve is degenerative. No evidence of mitral valve regurgitation. No evidence of mitral stenosis.  4. The aortic valve is tricuspid. Aortic valve regurgitation is not visualized. No aortic stenosis is present.  5. The inferior vena cava is normal in size with <50% respiratory variability, suggesting right atrial pressure of 8 mmHg. FINDINGS  Left Ventricle: Left ventricular ejection fraction, by estimation, is 55 to 60%. The left ventricle has normal function. The left ventricle has no regional wall motion abnormalities. The left ventricular internal cavity size was normal in size. There is  no left ventricular hypertrophy. Left ventricular  diastolic parameters are consistent with age-related delayed relaxation (normal). Right Ventricle: The right ventricular size is normal. No increase in right ventricular wall thickness. Right ventricular systolic function is normal. There is normal pulmonary artery systolic pressure. The tricuspid regurgitant velocity is 1.98 m/s, and  with an assumed right atrial pressure of 8 mmHg, the estimated right ventricular systolic pressure is 23.7 mmHg. Left Atrium: Left atrial size was normal in size. Right Atrium: Right atrial size was normal in size. Pericardium: There is no evidence of pericardial effusion. Mitral Valve: The mitral valve is degenerative in appearance. Mild mitral annular calcification. No evidence of mitral valve regurgitation. No evidence of mitral valve stenosis. Tricuspid Valve: The tricuspid valve is grossly normal. Tricuspid valve regurgitation is trivial. Aortic Valve: The aortic valve is tricuspid. Aortic valve regurgitation is not visualized. No  aortic stenosis is present. Pulmonic Valve: The pulmonic valve was grossly normal. Pulmonic valve regurgitation is not visualized. Aorta: The aortic root is normal in size and structure. Venous: The inferior vena cava is normal in size with less than 50% respiratory variability, suggesting right atrial pressure of 8 mmHg. IAS/Shunts: No atrial level shunt detected by color flow Doppler.  LEFT VENTRICLE PLAX 2D LVIDd:         4.00 cm  Diastology LVIDs:         2.50 cm  LV e' lateral:   13.40 cm/s LV PW:         1.20 cm  LV E/e' lateral: 6.7 LV IVS:        1.00 cm  LV e' medial:    7.51 cm/s LVOT diam:     1.80 cm  LV E/e' medial:  12.0 LV SV:         45 LV SV Index:   23 LVOT Area:     2.54 cm  LEFT ATRIUM           Index LA diam:      3.70 cm 1.88 cm/m LA Vol (A2C): 67.5 ml 34.27 ml/m LA Vol (A4C): 51.6 ml 26.20 ml/m  AORTIC VALVE LVOT Vmax:   86.80 cm/s LVOT Vmean:  55.600 cm/s LVOT VTI:    0.175 m  AORTA Ao Root diam: 2.30 cm MITRAL VALVE                TRICUSPID VALVE MV Area (PHT): 3.99 cm     TR Peak grad:   15.7 mmHg MV Decel Time: 190 msec     TR Vmax:        198.18 cm/s MV E velocity: 90.40 cm/s MV A velocity: 107.00 cm/s  SHUNTS MV E/A ratio:  0.84         Systemic VTI:  0.18 m                             Systemic Diam: 1.80 cm Lennie Odor MD Electronically signed by Lennie Odor MD Signature Date/Time: 11/22/2019/4:40:05 PM    Final    DG Hips Bilat W or Wo Pelvis 3-4 Views  Result Date: 11/21/2019 CLINICAL DATA:  Fall and pain EXAM: DG HIP (WITH OR WITHOUT PELVIS) 3-4V BILAT COMPARISON:  None. FINDINGS: There is no evidence of hip fracture or dislocation, however somewhat limited due to diffuse osteopenia. Degenerative changes in the lower lumbar spine. IMPRESSION: No definite acute osseous abnormality. If there is high clinical suspicion for occult hip fracture or the patient refuses to weightbear, consider further evaluation  with MRI. Although CT is expeditious, evidence is lacking regarding  accuracy of CT over plain film radiography. Electronically Signed   By: Jonna Clark M.D.   On: 11/21/2019 17:01   CT Maxillofacial Wo Contrast  Result Date: 11/21/2019 CLINICAL DATA:  Head trauma, minor. Facial trauma. Poly trauma, critical, head/cervical spine injury suspected. Additional history provided: 86 year old female via EMS from home status post trip and fall yesterday at 5 p.m. while walking down outdoor steps, abrasions to face and left hand from hitting concrete EXAM: CT HEAD WITHOUT CONTRAST CT MAXILLOFACIAL WITHOUT CONTRAST CT CERVICAL SPINE WITHOUT CONTRAST TECHNIQUE: Contiguous axial images were obtained from the base of the skull through the vertex without intravenous contrast. Multidetector CT imaging of the maxillofacial structures was performed. Multiplanar CT image reconstructions were also generated. A small metallic BB was placed on the right temple in order to reliably differentiate right from left. Multidetector CT imaging of the cervical spine was performed without intravenous contrast. Multiplanar CT image reconstructions were also generated. COMPARISON:  CT head/cervical spine 02/04/2015 FINDINGS: CT HEAD FINDINGS Brain: No evidence of acute intracranial hemorrhage. No demarcated cortical infarction. No evidence of intracranial mass. No midline shift or extra-axial fluid collection. Stable, mild generalized parenchymal atrophy. Vascular: No hyperdense vessel. Skull: Normal. Negative for fracture or focal lesion. CT MAXILLOFACIAL FINDINGS Osseous: No evidence of acute maxillofacial fracture. Orbits: No acute abnormality. Sinuses: Moderate/severe mucosal thickening within the right maxillary sinus. Associated hyperdensity within the right maxillary sinus, which may reflect inspissated secretions or sequela of chronic fungal sinusitis. There is a small defect within the floor of the right maxillary sinus (series 9, image 21). There is also moderate mucosal thickening within the  inferior left maxillary sinus. There is prominent periapical lucency surrounding the adjacent posterior left maxillary molar with cortical breakthrough (series 10, image 46) and the possibility of odontogenic sinusitis is raised. Additional mild mucosal thickening within the bilateral frontal and ethmoid air cells. Soft tissues: Mild soft tissue swelling along the forehead. CT CERVICAL SPINE FINDINGS Alignment: Straightening of the expected cervical lordosis. Trace C4-C5 and C5-C6 anterolisthesis. Skull base and vertebrae: The basion-dental and atlanto-dental intervals are maintained.No evidence of acute fracture to the cervical spine. Soft tissues and spinal canal: No prevertebral fluid or swelling. No visible canal hematoma. Calcified plaque within the carotid arteries. Disc levels: Cervical spondylosis with multilevel posterior disc osteophytes, uncovertebral and facet hypertrophy. No high-grade bony spinal canal stenosis. Multilevel neural foraminal narrowing greatest on the right at C3-C4, bilaterally at C5-C6 and bilaterally at C6-C7. Upper chest: No consolidation within the imaged lung apices. No visible pneumothorax. IMPRESSION: CT head: 1. No evidence of acute intracranial abnormality. 2. Stable, mild generalized parenchymal atrophy. CT maxillofacial: 1. No evidence of acute maxillofacial fracture. 2. Mild forehead soft tissue swelling. 3. Paranasal sinus disease as described. Most notably, there is more moderate/severe mucosal thickening within the right maxillary sinus. There is also hyperdensity within the right maxillary sinus which may reflect inspissated secretions or sequela of chronic fungal sinusitis. A small defect within the floor of the right maxillary may reflect an oro-antral fistula. Also of note, adjacent to the mucosal thickening within the inferior left maxillary sinus there is prominent periapical lucency surrounding the posterior left maxillary molar with cortical breakthrough. This  raises the possibility of odontogenic sinusitis. CT cervical spine: 1. No evidence of acute fracture to the cervical spine. 2. Cervical spondylosis as described. Electronically Signed   By: Jackey Loge DO   On: 11/21/2019 17:58  Echocardiogram 11/22/2019:  1. Left ventricular ejection fraction, by estimation, is 55 to 60%. The  left ventricle has normal function. The left ventricle has no regional  wall motion abnormalities. Left ventricular diastolic parameters are  consistent with age-related delayed  relaxation (normal).  2. Right ventricular systolic function is normal. The right ventricular  size is normal. There is normal pulmonary artery systolic pressure. The  estimated right ventricular systolic pressure is 49.4 mmHg.  3. The mitral valve is degenerative. No evidence of mitral valve  regurgitation. No evidence of mitral stenosis.  4. The aortic valve is tricuspid. Aortic valve regurgitation is not  visualized. No aortic stenosis is present.  5. The inferior vena cava is normal in size with <50% respiratory  variability, suggesting right atrial pressure of 8 mmHg.   Subjective: Feels well, is hungry. Pain controlled, wants to get back to getting up on ladders and refurbishing cabinets. No chest pain or palpitations since admission. No dyspnea currently.  Discharge Exam: Vitals:   11/22/19 2053 11/23/19 0552  BP: (!) 135/58 140/78  Pulse: 75 67  Resp: 16 17  Temp: 99.4 F (37.4 C) 98.9 F (37.2 C)  SpO2: 93% 94%   General: Pt is alert, awake, not in acute distress Cardiovascular: RRR, S1/S2 +, no rubs, no gallops Respiratory: CTA bilaterally, no wheezing, no rhonchi Abdominal: Soft, NT, ND, bowel sounds + Extremities: No edema, no cyanosis  Labs: BNP (last 3 results) No results for input(s): BNP in the last 8760 hours. Basic Metabolic Panel: Recent Labs  Lab 11/21/19 1547 11/22/19 0355  NA 142 141  K 3.8 4.2  CL 105 110  CO2 26 23  GLUCOSE 91 90  BUN  28* 19  CREATININE 1.03* 0.73  CALCIUM 9.2 8.4*  MG  --  2.1   Liver Function Tests: Recent Labs  Lab 11/22/19 0355  AST 19  ALT 12  ALKPHOS 42  BILITOT 0.8  PROT 6.1*  ALBUMIN 2.9*   No results for input(s): LIPASE, AMYLASE in the last 168 hours. No results for input(s): AMMONIA in the last 168 hours. CBC: Recent Labs  Lab 11/21/19 1547 11/22/19 0355  WBC 7.4 4.9  NEUTROABS 4.3  --   HGB 15.6* 14.0  HCT 48.2* 43.8  MCV 97.6 100.7*  PLT 172 162   Cardiac Enzymes: No results for input(s): CKTOTAL, CKMB, CKMBINDEX, TROPONINI in the last 168 hours. BNP: Invalid input(s): POCBNP CBG: No results for input(s): GLUCAP in the last 168 hours. D-Dimer No results for input(s): DDIMER in the last 72 hours. Hgb A1c No results for input(s): HGBA1C in the last 72 hours. Lipid Profile No results for input(s): CHOL, HDL, LDLCALC, TRIG, CHOLHDL, LDLDIRECT in the last 72 hours. Thyroid function studies Recent Labs    11/22/19 0355  TSH 2.448   Anemia work up No results for input(s): VITAMINB12, FOLATE, FERRITIN, TIBC, IRON, RETICCTPCT in the last 72 hours. Urinalysis    Component Value Date/Time   COLORURINE YELLOW 02/05/2015 0510   APPEARANCEUR CLEAR 02/05/2015 0510   LABSPEC 1.007 02/05/2015 0510   PHURINE 6.0 02/05/2015 0510   GLUCOSEU NEGATIVE 02/05/2015 0510   HGBUR NEGATIVE 02/05/2015 0510   BILIRUBINUR NEGATIVE 02/05/2015 0510   KETONESUR NEGATIVE 02/05/2015 0510   PROTEINUR NEGATIVE 02/05/2015 0510   UROBILINOGEN 0.2 02/05/2015 0510   NITRITE NEGATIVE 02/05/2015 0510   LEUKOCYTESUR NEGATIVE 02/05/2015 0510    Microbiology Recent Results (from the past 240 hour(s))  SARS CORONAVIRUS 2 (TAT 6-24 HRS) Nasopharyngeal Nasopharyngeal Swab  Status: None   Collection Time: 11/21/19  7:28 PM   Specimen: Nasopharyngeal Swab  Result Value Ref Range Status   SARS Coronavirus 2 NEGATIVE NEGATIVE Final    Comment: (NOTE) SARS-CoV-2 target nucleic acids are NOT  DETECTED. The SARS-CoV-2 RNA is generally detectable in upper and lower respiratory specimens during the acute phase of infection. Negative results do not preclude SARS-CoV-2 infection, do not rule out co-infections with other pathogens, and should not be used as the sole basis for treatment or other patient management decisions. Negative results must be combined with clinical observations, patient history, and epidemiological information. The expected result is Negative. Fact Sheet for Patients: HairSlick.no Fact Sheet for Healthcare Providers: quierodirigir.com This test is not yet approved or cleared by the [place] FDA and  has been authorized for detection and/or diagnosis of SARS-CoV-2 by FDA under an Emergency Use Authorization (EUA). This EUA will remain  in effect (meaning this test can be used) for the duration of the COVID-19 declaration under Section 56 4(b)(1) of the Act, 21 U.S.C. section 360bbb-3(b)(1), unless the authorization is terminated or revoked sooner. Performed at [facility], [address] [place], [place] [address]     Time coordinating discharge: Approximately 40 minutes  Tyrone Nine, MD  [facility] 11/23/2019, 8:41 AM

## 2019-11-23 NOTE — Progress Notes (Addendum)
Pt is reported to have had 3 beats of SVT did not sustain. Attending Jarvis Newcomer MD notified.

## 2019-11-23 NOTE — Progress Notes (Signed)
Pt discharged to home, daughter will have friend pick up pt at 2 pm. SRP, RN

## 2019-12-11 DIAGNOSIS — I4891 Unspecified atrial fibrillation: Secondary | ICD-10-CM | POA: Diagnosis not present

## 2019-12-11 DIAGNOSIS — R42 Dizziness and giddiness: Secondary | ICD-10-CM | POA: Diagnosis not present

## 2019-12-15 DIAGNOSIS — R42 Dizziness and giddiness: Secondary | ICD-10-CM | POA: Diagnosis not present

## 2019-12-15 DIAGNOSIS — S0990XS Unspecified injury of head, sequela: Secondary | ICD-10-CM | POA: Diagnosis not present

## 2019-12-15 DIAGNOSIS — W109XXS Fall (on) (from) unspecified stairs and steps, sequela: Secondary | ICD-10-CM | POA: Diagnosis not present

## 2019-12-15 DIAGNOSIS — M8580 Other specified disorders of bone density and structure, unspecified site: Secondary | ICD-10-CM | POA: Diagnosis not present

## 2019-12-15 DIAGNOSIS — Z9181 History of falling: Secondary | ICD-10-CM | POA: Diagnosis not present

## 2019-12-15 DIAGNOSIS — I4891 Unspecified atrial fibrillation: Secondary | ICD-10-CM | POA: Diagnosis not present

## 2019-12-15 DIAGNOSIS — Z7901 Long term (current) use of anticoagulants: Secondary | ICD-10-CM | POA: Diagnosis not present

## 2019-12-20 DIAGNOSIS — M8580 Other specified disorders of bone density and structure, unspecified site: Secondary | ICD-10-CM | POA: Diagnosis not present

## 2019-12-20 DIAGNOSIS — S0990XS Unspecified injury of head, sequela: Secondary | ICD-10-CM | POA: Diagnosis not present

## 2019-12-20 DIAGNOSIS — Z7901 Long term (current) use of anticoagulants: Secondary | ICD-10-CM | POA: Diagnosis not present

## 2019-12-20 DIAGNOSIS — Z9181 History of falling: Secondary | ICD-10-CM | POA: Diagnosis not present

## 2019-12-20 DIAGNOSIS — I4891 Unspecified atrial fibrillation: Secondary | ICD-10-CM | POA: Diagnosis not present

## 2019-12-20 DIAGNOSIS — R42 Dizziness and giddiness: Secondary | ICD-10-CM | POA: Diagnosis not present

## 2019-12-20 DIAGNOSIS — W109XXS Fall (on) (from) unspecified stairs and steps, sequela: Secondary | ICD-10-CM | POA: Diagnosis not present

## 2019-12-21 DIAGNOSIS — Z9181 History of falling: Secondary | ICD-10-CM | POA: Diagnosis not present

## 2019-12-21 DIAGNOSIS — R42 Dizziness and giddiness: Secondary | ICD-10-CM | POA: Diagnosis not present

## 2019-12-21 DIAGNOSIS — S0990XS Unspecified injury of head, sequela: Secondary | ICD-10-CM | POA: Diagnosis not present

## 2019-12-21 DIAGNOSIS — Z7901 Long term (current) use of anticoagulants: Secondary | ICD-10-CM | POA: Diagnosis not present

## 2019-12-21 DIAGNOSIS — M8580 Other specified disorders of bone density and structure, unspecified site: Secondary | ICD-10-CM | POA: Diagnosis not present

## 2019-12-21 DIAGNOSIS — I4891 Unspecified atrial fibrillation: Secondary | ICD-10-CM | POA: Diagnosis not present

## 2019-12-21 DIAGNOSIS — W109XXS Fall (on) (from) unspecified stairs and steps, sequela: Secondary | ICD-10-CM | POA: Diagnosis not present

## 2019-12-25 DIAGNOSIS — S0990XS Unspecified injury of head, sequela: Secondary | ICD-10-CM | POA: Diagnosis not present

## 2019-12-25 DIAGNOSIS — Z9181 History of falling: Secondary | ICD-10-CM | POA: Diagnosis not present

## 2019-12-25 DIAGNOSIS — R42 Dizziness and giddiness: Secondary | ICD-10-CM | POA: Diagnosis not present

## 2019-12-25 DIAGNOSIS — Z7901 Long term (current) use of anticoagulants: Secondary | ICD-10-CM | POA: Diagnosis not present

## 2019-12-25 DIAGNOSIS — M8580 Other specified disorders of bone density and structure, unspecified site: Secondary | ICD-10-CM | POA: Diagnosis not present

## 2019-12-25 DIAGNOSIS — W109XXS Fall (on) (from) unspecified stairs and steps, sequela: Secondary | ICD-10-CM | POA: Diagnosis not present

## 2019-12-25 DIAGNOSIS — I4891 Unspecified atrial fibrillation: Secondary | ICD-10-CM | POA: Diagnosis not present

## 2019-12-27 DIAGNOSIS — S0990XS Unspecified injury of head, sequela: Secondary | ICD-10-CM | POA: Diagnosis not present

## 2019-12-27 DIAGNOSIS — Z9181 History of falling: Secondary | ICD-10-CM | POA: Diagnosis not present

## 2019-12-27 DIAGNOSIS — W109XXS Fall (on) (from) unspecified stairs and steps, sequela: Secondary | ICD-10-CM | POA: Diagnosis not present

## 2019-12-27 DIAGNOSIS — Z7901 Long term (current) use of anticoagulants: Secondary | ICD-10-CM | POA: Diagnosis not present

## 2019-12-27 DIAGNOSIS — I4891 Unspecified atrial fibrillation: Secondary | ICD-10-CM | POA: Diagnosis not present

## 2019-12-27 DIAGNOSIS — R42 Dizziness and giddiness: Secondary | ICD-10-CM | POA: Diagnosis not present

## 2019-12-27 DIAGNOSIS — M8580 Other specified disorders of bone density and structure, unspecified site: Secondary | ICD-10-CM | POA: Diagnosis not present

## 2019-12-28 DIAGNOSIS — I4891 Unspecified atrial fibrillation: Secondary | ICD-10-CM | POA: Diagnosis not present

## 2019-12-28 DIAGNOSIS — W109XXS Fall (on) (from) unspecified stairs and steps, sequela: Secondary | ICD-10-CM | POA: Diagnosis not present

## 2019-12-28 DIAGNOSIS — S0990XS Unspecified injury of head, sequela: Secondary | ICD-10-CM | POA: Diagnosis not present

## 2019-12-28 DIAGNOSIS — Z7901 Long term (current) use of anticoagulants: Secondary | ICD-10-CM | POA: Diagnosis not present

## 2019-12-28 DIAGNOSIS — M8580 Other specified disorders of bone density and structure, unspecified site: Secondary | ICD-10-CM | POA: Diagnosis not present

## 2019-12-28 DIAGNOSIS — R42 Dizziness and giddiness: Secondary | ICD-10-CM | POA: Diagnosis not present

## 2019-12-28 DIAGNOSIS — Z9181 History of falling: Secondary | ICD-10-CM | POA: Diagnosis not present

## 2019-12-29 DIAGNOSIS — W109XXS Fall (on) (from) unspecified stairs and steps, sequela: Secondary | ICD-10-CM | POA: Diagnosis not present

## 2019-12-29 DIAGNOSIS — M8580 Other specified disorders of bone density and structure, unspecified site: Secondary | ICD-10-CM | POA: Diagnosis not present

## 2019-12-29 DIAGNOSIS — Z9181 History of falling: Secondary | ICD-10-CM | POA: Diagnosis not present

## 2019-12-29 DIAGNOSIS — R42 Dizziness and giddiness: Secondary | ICD-10-CM | POA: Diagnosis not present

## 2019-12-29 DIAGNOSIS — I4891 Unspecified atrial fibrillation: Secondary | ICD-10-CM | POA: Diagnosis not present

## 2019-12-29 DIAGNOSIS — Z7901 Long term (current) use of anticoagulants: Secondary | ICD-10-CM | POA: Diagnosis not present

## 2019-12-29 DIAGNOSIS — S0990XS Unspecified injury of head, sequela: Secondary | ICD-10-CM | POA: Diagnosis not present

## 2020-01-01 DIAGNOSIS — R42 Dizziness and giddiness: Secondary | ICD-10-CM | POA: Diagnosis not present

## 2020-01-01 DIAGNOSIS — W109XXS Fall (on) (from) unspecified stairs and steps, sequela: Secondary | ICD-10-CM | POA: Diagnosis not present

## 2020-01-01 DIAGNOSIS — M8580 Other specified disorders of bone density and structure, unspecified site: Secondary | ICD-10-CM | POA: Diagnosis not present

## 2020-01-01 DIAGNOSIS — Z9181 History of falling: Secondary | ICD-10-CM | POA: Diagnosis not present

## 2020-01-01 DIAGNOSIS — Z7901 Long term (current) use of anticoagulants: Secondary | ICD-10-CM | POA: Diagnosis not present

## 2020-01-01 DIAGNOSIS — S0990XS Unspecified injury of head, sequela: Secondary | ICD-10-CM | POA: Diagnosis not present

## 2020-01-01 DIAGNOSIS — I4891 Unspecified atrial fibrillation: Secondary | ICD-10-CM | POA: Diagnosis not present

## 2020-01-02 DIAGNOSIS — M8580 Other specified disorders of bone density and structure, unspecified site: Secondary | ICD-10-CM | POA: Diagnosis not present

## 2020-01-02 DIAGNOSIS — R42 Dizziness and giddiness: Secondary | ICD-10-CM | POA: Diagnosis not present

## 2020-01-02 DIAGNOSIS — Z9181 History of falling: Secondary | ICD-10-CM | POA: Diagnosis not present

## 2020-01-02 DIAGNOSIS — S0990XS Unspecified injury of head, sequela: Secondary | ICD-10-CM | POA: Diagnosis not present

## 2020-01-02 DIAGNOSIS — I4891 Unspecified atrial fibrillation: Secondary | ICD-10-CM | POA: Diagnosis not present

## 2020-01-02 DIAGNOSIS — Z7901 Long term (current) use of anticoagulants: Secondary | ICD-10-CM | POA: Diagnosis not present

## 2020-01-02 DIAGNOSIS — W109XXS Fall (on) (from) unspecified stairs and steps, sequela: Secondary | ICD-10-CM | POA: Diagnosis not present

## 2020-01-03 DIAGNOSIS — M8580 Other specified disorders of bone density and structure, unspecified site: Secondary | ICD-10-CM | POA: Diagnosis not present

## 2020-01-03 DIAGNOSIS — W109XXS Fall (on) (from) unspecified stairs and steps, sequela: Secondary | ICD-10-CM | POA: Diagnosis not present

## 2020-01-03 DIAGNOSIS — Z7901 Long term (current) use of anticoagulants: Secondary | ICD-10-CM | POA: Diagnosis not present

## 2020-01-03 DIAGNOSIS — Z9181 History of falling: Secondary | ICD-10-CM | POA: Diagnosis not present

## 2020-01-03 DIAGNOSIS — R42 Dizziness and giddiness: Secondary | ICD-10-CM | POA: Diagnosis not present

## 2020-01-03 DIAGNOSIS — S0990XS Unspecified injury of head, sequela: Secondary | ICD-10-CM | POA: Diagnosis not present

## 2020-01-03 DIAGNOSIS — I4891 Unspecified atrial fibrillation: Secondary | ICD-10-CM | POA: Diagnosis not present

## 2020-01-04 DIAGNOSIS — Z9181 History of falling: Secondary | ICD-10-CM | POA: Diagnosis not present

## 2020-01-04 DIAGNOSIS — R42 Dizziness and giddiness: Secondary | ICD-10-CM | POA: Diagnosis not present

## 2020-01-04 DIAGNOSIS — W109XXS Fall (on) (from) unspecified stairs and steps, sequela: Secondary | ICD-10-CM | POA: Diagnosis not present

## 2020-01-04 DIAGNOSIS — S0990XS Unspecified injury of head, sequela: Secondary | ICD-10-CM | POA: Diagnosis not present

## 2020-01-04 DIAGNOSIS — I4891 Unspecified atrial fibrillation: Secondary | ICD-10-CM | POA: Diagnosis not present

## 2020-01-04 DIAGNOSIS — M8580 Other specified disorders of bone density and structure, unspecified site: Secondary | ICD-10-CM | POA: Diagnosis not present

## 2020-01-04 DIAGNOSIS — Z7901 Long term (current) use of anticoagulants: Secondary | ICD-10-CM | POA: Diagnosis not present

## 2020-01-08 DIAGNOSIS — R55 Syncope and collapse: Secondary | ICD-10-CM | POA: Diagnosis not present

## 2020-01-08 DIAGNOSIS — M8580 Other specified disorders of bone density and structure, unspecified site: Secondary | ICD-10-CM | POA: Diagnosis not present

## 2020-01-08 DIAGNOSIS — R42 Dizziness and giddiness: Secondary | ICD-10-CM | POA: Diagnosis not present

## 2020-01-08 DIAGNOSIS — W109XXS Fall (on) (from) unspecified stairs and steps, sequela: Secondary | ICD-10-CM | POA: Diagnosis not present

## 2020-01-08 DIAGNOSIS — I4891 Unspecified atrial fibrillation: Secondary | ICD-10-CM | POA: Diagnosis not present

## 2020-01-08 DIAGNOSIS — Z9181 History of falling: Secondary | ICD-10-CM | POA: Diagnosis not present

## 2020-01-08 DIAGNOSIS — Z7901 Long term (current) use of anticoagulants: Secondary | ICD-10-CM | POA: Diagnosis not present

## 2020-01-08 DIAGNOSIS — S0990XS Unspecified injury of head, sequela: Secondary | ICD-10-CM | POA: Diagnosis not present

## 2020-01-08 DIAGNOSIS — M542 Cervicalgia: Secondary | ICD-10-CM | POA: Diagnosis not present

## 2020-01-10 DIAGNOSIS — I4891 Unspecified atrial fibrillation: Secondary | ICD-10-CM | POA: Diagnosis not present

## 2020-01-10 DIAGNOSIS — W109XXS Fall (on) (from) unspecified stairs and steps, sequela: Secondary | ICD-10-CM | POA: Diagnosis not present

## 2020-01-10 DIAGNOSIS — R42 Dizziness and giddiness: Secondary | ICD-10-CM | POA: Diagnosis not present

## 2020-01-10 DIAGNOSIS — S0990XS Unspecified injury of head, sequela: Secondary | ICD-10-CM | POA: Diagnosis not present

## 2020-01-10 DIAGNOSIS — Z9181 History of falling: Secondary | ICD-10-CM | POA: Diagnosis not present

## 2020-01-10 DIAGNOSIS — M8580 Other specified disorders of bone density and structure, unspecified site: Secondary | ICD-10-CM | POA: Diagnosis not present

## 2020-01-10 DIAGNOSIS — Z7901 Long term (current) use of anticoagulants: Secondary | ICD-10-CM | POA: Diagnosis not present

## 2020-01-11 ENCOUNTER — Ambulatory Visit (INDEPENDENT_AMBULATORY_CARE_PROVIDER_SITE_OTHER): Payer: Medicare HMO | Admitting: Cardiovascular Disease

## 2020-01-11 ENCOUNTER — Emergency Department (HOSPITAL_COMMUNITY)
Admission: EM | Admit: 2020-01-11 | Discharge: 2020-01-11 | Disposition: A | Discharge status: Home / Self Care | Payer: Medicare HMO | Attending: Emergency Medicine | Admitting: Emergency Medicine

## 2020-01-11 ENCOUNTER — Emergency Department (HOSPITAL_COMMUNITY): Payer: Medicare HMO

## 2020-01-11 ENCOUNTER — Ambulatory Visit: Payer: Medicare HMO

## 2020-01-11 ENCOUNTER — Other Ambulatory Visit: Payer: Self-pay

## 2020-01-11 ENCOUNTER — Encounter: Payer: Self-pay | Admitting: Cardiovascular Disease

## 2020-01-11 VITALS — BP 128/80 | HR 62 | Temp 98.0°F | Ht 64.5 in | Wt 198.2 lb

## 2020-01-11 DIAGNOSIS — I493 Ventricular premature depolarization: Secondary | ICD-10-CM

## 2020-01-11 DIAGNOSIS — I491 Atrial premature depolarization: Secondary | ICD-10-CM | POA: Diagnosis not present

## 2020-01-11 DIAGNOSIS — R079 Chest pain, unspecified: Secondary | ICD-10-CM | POA: Diagnosis not present

## 2020-01-11 DIAGNOSIS — I4891 Unspecified atrial fibrillation: Secondary | ICD-10-CM | POA: Diagnosis not present

## 2020-01-11 DIAGNOSIS — R0602 Shortness of breath: Secondary | ICD-10-CM | POA: Diagnosis not present

## 2020-01-11 DIAGNOSIS — Z7901 Long term (current) use of anticoagulants: Secondary | ICD-10-CM | POA: Insufficient documentation

## 2020-01-11 DIAGNOSIS — R0789 Other chest pain: Secondary | ICD-10-CM | POA: Diagnosis not present

## 2020-01-11 DIAGNOSIS — R42 Dizziness and giddiness: Secondary | ICD-10-CM | POA: Diagnosis not present

## 2020-01-11 DIAGNOSIS — I495 Sick sinus syndrome: Secondary | ICD-10-CM | POA: Diagnosis not present

## 2020-01-11 DIAGNOSIS — R001 Bradycardia, unspecified: Secondary | ICD-10-CM | POA: Diagnosis not present

## 2020-01-11 DIAGNOSIS — R9431 Abnormal electrocardiogram [ECG] [EKG]: Secondary | ICD-10-CM | POA: Diagnosis present

## 2020-01-11 HISTORY — DX: Sick sinus syndrome: I49.5

## 2020-01-11 HISTORY — DX: Ventricular premature depolarization: I49.3

## 2020-01-11 LAB — CBC WITH DIFFERENTIAL/PLATELET
Abs Immature Granulocytes: 0.01 10*3/uL (ref 0.00–0.07)
Basophils Absolute: 0 10*3/uL (ref 0.0–0.1)
Basophils Relative: 0 %
Eosinophils Absolute: 0.2 10*3/uL (ref 0.0–0.5)
Eosinophils Relative: 3 %
HCT: 46.3 % — ABNORMAL HIGH (ref 36.0–46.0)
Hemoglobin: 14.7 g/dL (ref 12.0–15.0)
Immature Granulocytes: 0 %
Lymphocytes Relative: 30 %
Lymphs Abs: 1.8 10*3/uL (ref 0.7–4.0)
MCH: 31.5 pg (ref 26.0–34.0)
MCHC: 31.7 g/dL (ref 30.0–36.0)
MCV: 99.1 fL (ref 80.0–100.0)
Monocytes Absolute: 0.4 10*3/uL (ref 0.1–1.0)
Monocytes Relative: 7 %
Neutro Abs: 3.6 10*3/uL (ref 1.7–7.7)
Neutrophils Relative %: 60 %
Platelets: 178 10*3/uL (ref 150–400)
RBC: 4.67 MIL/uL (ref 3.87–5.11)
RDW: 13.3 % (ref 11.5–15.5)
WBC: 6 10*3/uL (ref 4.0–10.5)
nRBC: 0 % (ref 0.0–0.2)

## 2020-01-11 LAB — BASIC METABOLIC PANEL
Anion gap: 8 (ref 5–15)
BUN: 18 mg/dL (ref 8–23)
CO2: 25 mmol/L (ref 22–32)
Calcium: 9 mg/dL (ref 8.9–10.3)
Chloride: 107 mmol/L (ref 98–111)
Creatinine, Ser: 0.95 mg/dL (ref 0.44–1.00)
GFR calc Af Amer: >60 mL/min (ref >60)
GFR calc non Af Amer: 55 mL/min — ABNORMAL LOW (ref >60)
Glucose, Bld: 90 mg/dL (ref 70–99)
Potassium: 4.1 mmol/L (ref 3.5–5.1)
Sodium: 140 mmol/L (ref 135–145)

## 2020-01-11 LAB — TROPONIN I (HIGH SENSITIVITY)
Troponin I (High Sensitivity): 5 ng/L (ref <18)
Troponin I (High Sensitivity): 7 ng/L (ref <18)

## 2020-01-11 LAB — MAGNESIUM: Magnesium: 2 mg/dL (ref 1.7–2.4)

## 2020-01-11 NOTE — Progress Notes (Signed)
Cardiology Office Note   Date:  01/11/2020   ID:  Angela Leach, DOB 1932/11/20, MRN [id number]  PCP:  Asencion Gowda.August Saucer, MD  Cardiologist:   Chilton Si, MD   No chief complaint on file.    History of Present Illness: Angela Leach is a 86 y.o. female with atrial fibrillation, mitral regurgitation who is being seen today for the evaluation of atrial fibirllaiton at the request of Angela Leach, L.August Saucer, MD.  She was seen in the ED 10/2019 after a fall down the stairs.  It was a mechanical fall and she fell face down.  In the ED she was found to be in atrial fibrillation with rapid ventricular response.  She spontaneously converted to sinus rhythm.  She was started on Eliquis in the hospital.  She had an echo at that time that revealed LVEF 55 to 60% with grade 1 diastolic dysfunction.  She notes that her heart sometimes races.  She feels woozy when she tries to stand up.  She also had some episodes of heart racing but she always thought she was just getting over excited.  Lately she and her daughter have been renovating their home.  She has been painting the walls and getting extremely fatigued.  She has occasional chest pressure that she attributed to old age.  She describes it as substernal and sometimes radiates to her back.  It does make her short of breath and nauseous.  She has not experienced any lower extremity edema, orthopnea, or PND.   Past Medical History:  Diagnosis Date  . PVC (premature ventricular contraction) 01/11/2020  . 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 No pertinent surgical history.   Current Outpatient Medications  Medication Sig Dispense Refill  . apixaban (ELIQUIS) 5 MG TABS tablet Take 1 tablet (5 mg total) by mouth 2 (two) times daily. 60 tablet 0  . CALCIUM-VITAMIN D PO Take 1 tablet by mouth every evening.    . Misc Natural Products (WHITE WILLOW BARK PO) Take 1 tablet by mouth daily.    . Multiple Vitamins-Minerals  (MULTIVITAMIN ADULTS) TABS Take 1 tablet by mouth daily.     No current facility-administered medications for this visit.    Allergies:   Penicillins    Social History:  The patient  reports that she has never smoked. She has never used smokeless tobacco.   Family History:  The patient's family history includes Rheumatic fever in her sister; Stroke in her brother.    ROS:  Please see the history of present illness.   Otherwise, review of systems are positive for none.   All other systems are reviewed and negative.    PHYSICAL EXAM: VS:  BP 128/80   Pulse 62   Temp 98 F (36.7 C)   Ht 5' 4.5" (1.638 m)   Wt 198 lb 3.2 oz (89.9 kg)   SpO2 97%   BMI 33.50 kg/m  , BMI Body mass index is 33.5 kg/m. GENERAL:  Well appearing HEENT:  Pupils equal round and reactive, fundi not visualized, oral mucosa unremarkable NECK:  No jugular venous distention, waveform within normal limits, carotid upstroke brisk and symmetric, no bruits, no thyromegaly LYMPHATICS:  No cervical adenopathy LUNGS:  Wheezes at R base HEART:  RRR.  PMI not displaced or sustained,S1 and S2 within normal limits, no S3, no S4, no clicks, no rubs, no murmurs ABD:  Flat, positive bowel sounds normal in frequency in pitch, no bruits, no rebound, no guarding, no  midline pulsatile mass, no hepatomegaly, no splenomegaly EXT:  2 plus pulses throughout, no edema, no cyanosis no clubbing SKIN:  No rashes no nodules NEURO:  Cranial nerves II through XII grossly intact, motor grossly intact throughout PSYCH:  Cognitively intact, oriented to person place and time   EKG:  EKG is ordered today. The ekg ordered today demonstrates sinus rhythm.  Rate 62 bpm.  PVCs. Inferior TWI  Echo 11/22/19: IMPRESSIONS    1. Left ventricular ejection fraction, by estimation, is 55 to 60%. The  left ventricle has normal function. The left ventricle has no regional  wall motion abnormalities. Left ventricular diastolic parameters are    consistent with age-related delayed  relaxation (normal).  2. Right ventricular systolic function is normal. The right ventricular  size is normal. There is normal pulmonary artery systolic pressure. The  estimated right ventricular systolic pressure is 64.3 mmHg.  3. The mitral valve is degenerative. No evidence of mitral valve  regurgitation. No evidence of mitral stenosis.  4. The aortic valve is tricuspid. Aortic valve regurgitation is not  visualized. No aortic stenosis is present.  5. The inferior vena cava is normal in size with <50% respiratory  variability, suggesting right atrial pressure of 8 mmHg.   Recent Labs: 11/22/2019: ALT 12; BUN 19; Creatinine, Ser 0.73; Hemoglobin 14.0; Magnesium 2.1; Platelets 162; Potassium 4.2; Sodium 141; TSH 2.448    Lipid Panel No results found for: CHOL, TRIG, HDL, CHOLHDL, VLDL, LDLCALC, LDLDIRECT    Wt Readings from Last 3 Encounters:  01/11/20 198 lb 3.2 oz (89.9 kg)  11/21/19 197 lb 15.6 oz (89.8 kg)  02/05/15 207 lb 0.2 oz (93.9 kg)      ASSESSMENT AND PLAN:  # PAF:   Angela Leach had an episode of atrial fibrillation in the hospital.  It seems that she may have been in and out of atrial fibrillation even prior to this event.  She is now having frequent PVCs.  Bradycardia prohibits the use of a nodal agent.  Continue Eliquis.  Echocardiogram was unremarkable.  # Chest pain:  # PVCs: Angela Leach is having episodes of exertional chest pain.  She also has ventricular trigeminy on EKG today.  Her daughter is in the office today and having unstable angina.  We will refer them both to the ED for further work-up to exclude ischemia.  Thyroid testing and blood counts were unremarkable.   Current medicines are reviewed at length with the patient today.  The patient does not have concerns regarding medicines.  The following changes have been made:  no change  Labs/ tests ordered today include:   Orders Placed This Encounter   Procedures  . EKG 12-Lead     Disposition:   FU with APP in 2 weeks.      Signed, Aniela Caniglia C. Oval Linsey, MD, [facility]  01/11/2020 9:33 AM    [place]

## 2020-01-11 NOTE — Discharge Instructions (Addendum)
Continue taking your Eliquis. You will need to follow-up with your primary care provider and your cardiologist as your workup today did not show any signs of heart attack or heart strain. Return to the ER if you start to experience worsening chest pain, shortness of breath, injuries or falls, headache or blurry vision.

## 2020-01-11 NOTE — ED Notes (Signed)
Patient Alert and oriented to baseline. Stable and ambulatory to baseline. Patient verbalized understanding of the discharge instructions.  Patient belongings were taken by the patient.   

## 2020-01-11 NOTE — ED Triage Notes (Signed)
Pt arrives [facility] from md office for bradycardia. Pt had a fall in feb and has had dizziness and chest pain since then. Recently dx with afib.  HR 60 130/80 99% RA

## 2020-01-11 NOTE — ED Provider Notes (Addendum)
[facility] EMERGENCY DEPARTMENT Provider Note   CSN: [id number] Arrival date & time: 01/11/20  1051     History Chief Complaint  Patient presents with  . Abnormal ECG    Angela Leach is a 86 y.o. female with a past medical history of A. fib currently on Eliquis presenting to the ED from cardiologist office for abnormal EKG.  Patient was seeing her cardiologist today at the request of her PCP.  Her home health nurse wanted the patient to see her PCP due to "irregular heartbeat."  She states that ever since she suffered from a mechanical fall in February 2021, found to be in A. fib with RVR and placed on Eliquis, she has had persistent "swimmy headedness" as well as intermittent chest pain, shortness of breath and headache.  States that her symptoms have not worsened today and she feels "just dealing with the same thing since February."  She has been taking her Eliquis as directed.  Denies any worsening chest pain, additional injuries or falls, palpitations, vomiting, diarrhea, changes to activity or appetite.  HPI     Past Medical History:  Diagnosis Date  . PVC (premature ventricular contraction) 01/11/2020  . 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 PVC (premature ventricular contraction) 01/11/2020  . 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 Atrial fibrillation with RVR (HCC) 11/21/2019  . Fall at home, initial encounter 11/21/2019  . Mild renal insufficiency 11/21/2019  . Chronic sinusitis 11/21/2019  . Contusion of face   . Dizziness 02/04/2015    No past surgical history on file.   OB History   No obstetric history on file.     Family History  Problem Relation Age of Onset  . Rheumatic fever Sister   . Stroke Brother     Social History   Tobacco Use  . Smoking status: Never Smoker  . Smokeless tobacco: Never Used  Substance Use Topics  . Alcohol use: Not on  file  . Drug use: Not on file    Home Medications Prior to Admission medications   Medication Sig Start Date End Date Taking? Authorizing Provider  apixaban (ELIQUIS) 5 MG TABS tablet Take 1 tablet (5 mg total) by mouth 2 (two) times daily. 11/23/19   Tyrone Nine, MD  CALCIUM-VITAMIN D PO Take 1 tablet by mouth every evening.    [provider]  Misc Natural Products (WHITE WILLOW BARK PO) Take 1 tablet by mouth daily.    [provider]  Multiple Vitamins-Minerals (MULTIVITAMIN ADULTS) TABS Take 1 tablet by mouth daily.    [provider]    Allergies    Penicillins  Review of Systems   Review of Systems  Constitutional: Negative for appetite change, chills and fever.  HENT: Negative for ear pain, rhinorrhea, sneezing and sore throat.   Eyes: Negative for photophobia and visual disturbance.  Respiratory: Positive for shortness of breath (since 10/2019). Negative for cough, chest tightness and wheezing.   Cardiovascular: Positive for chest pain (Since 10/2019). Negative for palpitations.  Gastrointestinal: Negative for abdominal pain, blood in stool, constipation, diarrhea, nausea and vomiting.  Genitourinary: Negative for dysuria, hematuria and urgency.  Musculoskeletal: Negative for myalgias.  Skin: Negative for rash.  Neurological: Positive for light-headedness (since 10/2019). Negative for dizziness and weakness.    Physical Exam Updated Vital Signs BP 122/77   Pulse 60   Temp 98.6 F (37 C) (Oral)   Resp  12   SpO2 95%   Physical Exam Vitals and nursing note reviewed.  Constitutional:      General: She is not in acute distress.    Appearance: She is well-developed.  HENT:     Head: Normocephalic and atraumatic.     Nose: Nose normal.  Eyes:     General: No scleral icterus.       Right eye: No discharge.        Left eye: No discharge.     Conjunctiva/sclera: Conjunctivae normal.  Cardiovascular:     Rate and Rhythm: Normal rate and  regular rhythm.     Heart sounds: Normal heart sounds. No murmur. No friction rub. No gallop.   Pulmonary:     Effort: Pulmonary effort is normal. No respiratory distress.     Breath sounds: Normal breath sounds.  Abdominal:     General: Bowel sounds are normal. There is no distension.     Palpations: Abdomen is soft.     Tenderness: There is no abdominal tenderness. There is no guarding.  Musculoskeletal:        General: Normal range of motion.     Cervical back: Normal range of motion and neck supple.  Skin:    General: Skin is warm and dry.     Findings: No rash.  Neurological:     General: No focal deficit present.     Mental Status: She is alert and oriented to person, place, and time.     Cranial Nerves: No cranial nerve deficit.     Motor: No weakness or abnormal muscle tone.     Coordination: Coordination normal.     ED Results / Procedures / Treatments   Labs (all labs ordered are listed, but only abnormal results are displayed) Labs Reviewed  BASIC METABOLIC PANEL - Abnormal; Notable for the following components:      Result Value   GFR calc non Af Amer 55 (*)    All other components within normal limits  CBC WITH DIFFERENTIAL/PLATELET - Abnormal; Notable for the following components:   HCT 46.3 (*)    All other components within normal limits  MAGNESIUM  TROPONIN I (HIGH SENSITIVITY)  TROPONIN I (HIGH SENSITIVITY)    EKG EKG Interpretation  Date/Time:  Thursday January 11 2020 11:04:11 EDT Ventricular Rate:  60 PR Interval:    QRS Duration: 99 QT Interval:  422 QTC Calculation: 422 R Axis:   34 Text Interpretation: Sinus rhythm Ventricular premature complex Abnormal R-wave progression, early transition Confirmed by Raeford Razor [phone]) on 01/11/2020 11:23:14 AM   Radiology DG Chest 2 View  Result Date: 01/11/2020 CLINICAL DATA:  Atrial fibrillation EXAM: CHEST - 2 VIEW COMPARISON:  11/21/2019 FINDINGS: Heart and mediastinal contours are within normal  limits. No focal opacities or effusions. No acute bony abnormality. IMPRESSION: No active cardiopulmonary disease. Electronically Signed   By: Charlett Nose M.D.   On: 01/11/2020 11:48    Procedures Procedures (including critical care time)  Medications Ordered in ED Medications - No data to display  ED Course  I have reviewed the triage vital signs and the nursing notes.  Pertinent labs & imaging results that were available during my care of the patient were reviewed by me and considered in my medical decision making (see chart for details).    MDM Rules/Calculators/A&P                      86 year old female with a past medical  history of atrial fibrillation currently on Eliquis presenting to the ED from cardiologist office for abnormal EKG.  Patient seeing cardiologist today at the request of PCP.  Home health nurse concerned about "irregular heartbeat."  She suffered from a mechanical fall in February 2021, found to be in A. fib with RVR and placed on Eliquis after spontaneously converting to sinus rhythm.  Since this fall she has had persistent intermittent chest pain, shortness of breath and headache.  Symptoms have not worsened today and patient is actually unsure why her cardiologist wanted to send her to the ED today.  States that she has been dealing with the symptoms since February.  On exam patient speaking complete sentences without difficulty.  No signs of respiratory distress.  EKG showing sinus rhythm, PVCs.  CBC, BMP, magnesium unremarkable.  Serial troponins are negative. CXR without any acute findings.  I reviewed Dr. Blenda Mounts notes, cardiology who states that patient has been having exertional chest pain and was referred to the ED to exclude ischemia.  Feel that today's work-up recently showed there are no signs of ACS.  Patient with essentially unchanged symptoms since February.  Feel that she can be referred back to her cardiologist as well as her PCP.  Patient is agreeable  to this plan.  Patient is hemodynamically stable, in NAD. Evaluation does not show pathology that would require ongoing emergent intervention or inpatient treatment. I have personally reviewed and interpreted all lab work and imaging at today's ED visit. I explained the diagnosis to the patient. Pain has been managed and has no complaints prior to discharge. Patient is comfortable with above plan and is stable for discharge at this time. All questions were answered prior to disposition. Strict return precautions for returning to the ED were discussed. Encouraged follow up with PCP.   An After Visit Summary was printed and given to the patient.   Portions of this note were generated with Lobbyist. Dictation errors may occur despite best attempts at proofreading.  Final Clinical Impression(s) / ED Diagnoses Final diagnoses:  PVC (premature ventricular contraction)    Rx / DC Orders ED Discharge Orders    None          Delia Heady, PA-C 01/11/20 1506    Virgel Manifold, MD 01/13/20 1000

## 2020-01-15 DIAGNOSIS — M8580 Other specified disorders of bone density and structure, unspecified site: Secondary | ICD-10-CM | POA: Diagnosis not present

## 2020-01-15 DIAGNOSIS — Z9181 History of falling: Secondary | ICD-10-CM | POA: Diagnosis not present

## 2020-01-15 DIAGNOSIS — W109XXS Fall (on) (from) unspecified stairs and steps, sequela: Secondary | ICD-10-CM | POA: Diagnosis not present

## 2020-01-15 DIAGNOSIS — S0990XS Unspecified injury of head, sequela: Secondary | ICD-10-CM | POA: Diagnosis not present

## 2020-01-15 DIAGNOSIS — Z7901 Long term (current) use of anticoagulants: Secondary | ICD-10-CM | POA: Diagnosis not present

## 2020-01-15 DIAGNOSIS — I4891 Unspecified atrial fibrillation: Secondary | ICD-10-CM | POA: Diagnosis not present

## 2020-01-15 DIAGNOSIS — R42 Dizziness and giddiness: Secondary | ICD-10-CM | POA: Diagnosis not present

## 2020-01-16 DIAGNOSIS — R42 Dizziness and giddiness: Secondary | ICD-10-CM | POA: Diagnosis not present

## 2020-01-16 DIAGNOSIS — W109XXS Fall (on) (from) unspecified stairs and steps, sequela: Secondary | ICD-10-CM | POA: Diagnosis not present

## 2020-01-16 DIAGNOSIS — M8580 Other specified disorders of bone density and structure, unspecified site: Secondary | ICD-10-CM | POA: Diagnosis not present

## 2020-01-16 DIAGNOSIS — Z9181 History of falling: Secondary | ICD-10-CM | POA: Diagnosis not present

## 2020-01-16 DIAGNOSIS — Z7901 Long term (current) use of anticoagulants: Secondary | ICD-10-CM | POA: Diagnosis not present

## 2020-01-16 DIAGNOSIS — I4891 Unspecified atrial fibrillation: Secondary | ICD-10-CM | POA: Diagnosis not present

## 2020-01-16 DIAGNOSIS — S0990XS Unspecified injury of head, sequela: Secondary | ICD-10-CM | POA: Diagnosis not present

## 2020-01-17 DIAGNOSIS — Z9181 History of falling: Secondary | ICD-10-CM | POA: Diagnosis not present

## 2020-01-17 DIAGNOSIS — W109XXS Fall (on) (from) unspecified stairs and steps, sequela: Secondary | ICD-10-CM | POA: Diagnosis not present

## 2020-01-17 DIAGNOSIS — S0990XS Unspecified injury of head, sequela: Secondary | ICD-10-CM | POA: Diagnosis not present

## 2020-01-17 DIAGNOSIS — R42 Dizziness and giddiness: Secondary | ICD-10-CM | POA: Diagnosis not present

## 2020-01-17 DIAGNOSIS — M8580 Other specified disorders of bone density and structure, unspecified site: Secondary | ICD-10-CM | POA: Diagnosis not present

## 2020-01-17 DIAGNOSIS — I4891 Unspecified atrial fibrillation: Secondary | ICD-10-CM | POA: Diagnosis not present

## 2020-01-17 DIAGNOSIS — Z7901 Long term (current) use of anticoagulants: Secondary | ICD-10-CM | POA: Diagnosis not present

## 2020-01-18 ENCOUNTER — Ambulatory Visit: Payer: Medicare HMO | Attending: Internal Medicine

## 2020-01-18 DIAGNOSIS — Z23 Encounter for immunization: Secondary | ICD-10-CM

## 2020-01-18 NOTE — Progress Notes (Signed)
   Covid-19 Vaccination Clinic  Name:  Angela Leach    MRN: [id number] DOB: 08-18-1933  01/18/2020  [name] was observed post Covid-19 immunization for 15 minutes without incident. She was provided with Vaccine Information Sheet and instruction to access the V-Safe system.   [name] was instructed to call 911 with any severe reactions post vaccine: Marland Kitchen Difficulty breathing  . Swelling of face and throat  . A fast heartbeat  . A bad rash all over body  . Dizziness and weakness   Immunizations Administered    Name Date Dose VIS Date Route   Pfizer COVID-19 Vaccine 01/18/2020 11:08 AM 0.3 mL 11/22/2018 Intramuscular   Manufacturer: [employer], [employer]   Lot: [id number]   NDC: [address]-2

## 2020-01-19 DIAGNOSIS — Z9181 History of falling: Secondary | ICD-10-CM | POA: Diagnosis not present

## 2020-01-19 DIAGNOSIS — S0990XS Unspecified injury of head, sequela: Secondary | ICD-10-CM | POA: Diagnosis not present

## 2020-01-19 DIAGNOSIS — M8580 Other specified disorders of bone density and structure, unspecified site: Secondary | ICD-10-CM | POA: Diagnosis not present

## 2020-01-19 DIAGNOSIS — R42 Dizziness and giddiness: Secondary | ICD-10-CM | POA: Diagnosis not present

## 2020-01-19 DIAGNOSIS — W109XXS Fall (on) (from) unspecified stairs and steps, sequela: Secondary | ICD-10-CM | POA: Diagnosis not present

## 2020-01-19 DIAGNOSIS — Z7901 Long term (current) use of anticoagulants: Secondary | ICD-10-CM | POA: Diagnosis not present

## 2020-01-19 DIAGNOSIS — I4891 Unspecified atrial fibrillation: Secondary | ICD-10-CM | POA: Diagnosis not present

## 2020-01-22 ENCOUNTER — Telehealth: Payer: Self-pay | Admitting: Cardiovascular Disease

## 2020-01-22 DIAGNOSIS — W109XXS Fall (on) (from) unspecified stairs and steps, sequela: Secondary | ICD-10-CM | POA: Diagnosis not present

## 2020-01-22 DIAGNOSIS — Z9181 History of falling: Secondary | ICD-10-CM | POA: Diagnosis not present

## 2020-01-22 DIAGNOSIS — I493 Ventricular premature depolarization: Secondary | ICD-10-CM

## 2020-01-22 DIAGNOSIS — I4891 Unspecified atrial fibrillation: Secondary | ICD-10-CM

## 2020-01-22 DIAGNOSIS — Z7901 Long term (current) use of anticoagulants: Secondary | ICD-10-CM | POA: Diagnosis not present

## 2020-01-22 DIAGNOSIS — S0990XS Unspecified injury of head, sequela: Secondary | ICD-10-CM | POA: Diagnosis not present

## 2020-01-22 DIAGNOSIS — I495 Sick sinus syndrome: Secondary | ICD-10-CM

## 2020-01-22 DIAGNOSIS — M8580 Other specified disorders of bone density and structure, unspecified site: Secondary | ICD-10-CM | POA: Diagnosis not present

## 2020-01-22 DIAGNOSIS — R42 Dizziness and giddiness: Secondary | ICD-10-CM | POA: Diagnosis not present

## 2020-01-22 NOTE — Telephone Encounter (Signed)
Patient's daughter states she is calling to follow up in regards to patient wearing a monitor as previously discussed. Please advise.

## 2020-01-22 NOTE — Telephone Encounter (Signed)
Advised daughter and order placed  

## 2020-01-22 NOTE — Telephone Encounter (Signed)
Please have her wear a 3 day Zio.

## 2020-01-22 NOTE — Telephone Encounter (Signed)
Advised daughter will forward to Dr Duke Salvia for review

## 2020-01-23 ENCOUNTER — Encounter: Payer: Self-pay | Admitting: *Deleted

## 2020-01-23 DIAGNOSIS — W109XXS Fall (on) (from) unspecified stairs and steps, sequela: Secondary | ICD-10-CM | POA: Diagnosis not present

## 2020-01-23 DIAGNOSIS — R42 Dizziness and giddiness: Secondary | ICD-10-CM | POA: Diagnosis not present

## 2020-01-23 DIAGNOSIS — Z7901 Long term (current) use of anticoagulants: Secondary | ICD-10-CM | POA: Diagnosis not present

## 2020-01-23 DIAGNOSIS — S0990XS Unspecified injury of head, sequela: Secondary | ICD-10-CM | POA: Diagnosis not present

## 2020-01-23 DIAGNOSIS — I4891 Unspecified atrial fibrillation: Secondary | ICD-10-CM | POA: Diagnosis not present

## 2020-01-23 DIAGNOSIS — M8580 Other specified disorders of bone density and structure, unspecified site: Secondary | ICD-10-CM | POA: Diagnosis not present

## 2020-01-23 DIAGNOSIS — Z9181 History of falling: Secondary | ICD-10-CM | POA: Diagnosis not present

## 2020-01-23 NOTE — Progress Notes (Signed)
Patient ID: Angela Leach, female   DOB: 05-02-1933, 86 y.o.   MRN: [id number] 3 day ZIO XT  Long term holter monitor enrolled to be shipped to patients home.  Instructions mailed to home and will be included in the monitor kit.

## 2020-01-24 DIAGNOSIS — Z7901 Long term (current) use of anticoagulants: Secondary | ICD-10-CM | POA: Diagnosis not present

## 2020-01-24 DIAGNOSIS — S0990XS Unspecified injury of head, sequela: Secondary | ICD-10-CM | POA: Diagnosis not present

## 2020-01-24 DIAGNOSIS — R42 Dizziness and giddiness: Secondary | ICD-10-CM | POA: Diagnosis not present

## 2020-01-24 DIAGNOSIS — I4891 Unspecified atrial fibrillation: Secondary | ICD-10-CM | POA: Diagnosis not present

## 2020-01-24 DIAGNOSIS — M8580 Other specified disorders of bone density and structure, unspecified site: Secondary | ICD-10-CM | POA: Diagnosis not present

## 2020-01-24 DIAGNOSIS — W109XXS Fall (on) (from) unspecified stairs and steps, sequela: Secondary | ICD-10-CM | POA: Diagnosis not present

## 2020-01-24 DIAGNOSIS — Z9181 History of falling: Secondary | ICD-10-CM | POA: Diagnosis not present

## 2020-01-24 NOTE — Progress Notes (Deleted)
Cardiology Clinic Note   Patient Name: Angela Leach Date of Encounter: 01/24/2020  Primary Care Provider:  Clovis Riley, L.August Saucer, MD Primary Cardiologist:  Chilton Si, MD  Patient Profile    Zio sent 01/23/20 for PVc's needs to have appt rescheduled for 2-3 weeks.  Past Medical History    Past Medical History:  Diagnosis Date  . PVC (premature ventricular contraction) 01/11/2020  . 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  Allergies  Allergies  Allergen Reactions  . Penicillins     Did it involve swelling of the face/tongue/throat, SOB, or low BP? Y Did it involve sudden or severe rash/hives, skin peeling, or any reaction on the inside of your mouth or nose? Y Did you need to seek medical attention at a hospital or doctor's office? N When did it last happen?Over 30 years If all above answers are "NO", may proceed with cephalosporin use.     History of Present Illness    ***  Home Medications    Prior to Admission medications   Medication Sig Start Date End Date Taking? Authorizing Provider  apixaban (ELIQUIS) 5 MG TABS tablet Take 1 tablet (5 mg total) by mouth 2 (two) times daily. 11/23/19   Tyrone Nine, MD  CALCIUM-VITAMIN D PO Take 1 tablet by mouth every evening.    [provider]  Misc Natural Products (WHITE WILLOW BARK PO) Take 1 tablet by mouth daily.    [provider]  Multiple Vitamins-Minerals (MULTIVITAMIN ADULTS) TABS Take 1 tablet by mouth daily.    [provider]    Family History    Family History  Problem Relation Age of Onset  . Rheumatic fever Sister   . Stroke Brother    She indicated that the status of her sister is unknown. She indicated that the status of her brother is unknown.  Social History    Social History   Socioeconomic History  . Marital status: Married    Spouse name: Not on file  . Number of children: Not on file  . Years of education:  Not on file  . Highest education level: Not on file  Occupational History  . Not on file  Tobacco Use  . Smoking status: Never Smoker  . Smokeless tobacco: Never Used  Substance and Sexual Activity  . Alcohol use: Not on file  . Drug use: Not on file  . Sexual activity: Not on file  Other Topics Concern  . Not on file  Social History Narrative  . Not on file   Social Determinants of Health   Financial Resource Strain:   . Difficulty of Paying Living Expenses:   Food Insecurity:   . Worried About Programme researcher, broadcasting/film/video in the Last Year:   . Barista in the Last Year:   Transportation Needs:   . Freight forwarder (Medical):   Marland Kitchen Lack of Transportation (Non-Medical):   Physical Activity:   . Days of Exercise per Week:   . Minutes of Exercise per Session:   Stress:   . Feeling of Stress :   Social Connections:   . Frequency of Communication with Friends and Family:   . Frequency of Social Gatherings with Friends and Family:   . Attends Religious Services:   . Active Member of Clubs or Organizations:   . Attends Banker Meetings:   Marland Kitchen Marital Status:   Intimate Partner Violence:   . Fear of Current  or Ex-Partner:   . Emotionally Abused:   Marland Kitchen Physically Abused:   . Sexually Abused:      Review of Systems    General:  No chills, fever, night sweats or weight changes.  Cardiovascular:  No chest pain, dyspnea on exertion, edema, orthopnea, palpitations, paroxysmal nocturnal dyspnea. Dermatological: No rash, lesions/masses Respiratory: No cough, dyspnea Urologic: No hematuria, dysuria Abdominal:   No nausea, vomiting, diarrhea, bright red blood per rectum, melena, or hematemesis Neurologic:  No visual changes, wkns, changes in mental status. All other systems reviewed and are otherwise negative except as noted above.  Physical Exam    VS:  There were no vitals taken for this visit. , BMI There is no height or weight on file to calculate BMI. GEN:  Well nourished, well developed, in no acute distress. HEENT: normal. Neck: Supple, no JVD, carotid bruits, or masses. Cardiac: RRR, no murmurs, rubs, or gallops. No clubbing, cyanosis, edema.  Radials/DP/PT 2+ and equal bilaterally.  Respiratory:  Respirations regular and unlabored, clear to auscultation bilaterally. GI: Soft, nontender, nondistended, BS + x 4. MS: no deformity or atrophy. Skin: warm and dry, no rash. Neuro:  Strength and sensation are intact. Psych: Normal affect.  Accessory Clinical Findings    ECG personally reviewed by me today- *** - No acute changes  Assessment & Plan   1.  ***  Jossie Ng. Emanuele Mcwhirter NP-C    01/24/2020, 11:40 AM [address] Office [phone] Fax [phone]

## 2020-01-25 ENCOUNTER — Ambulatory Visit: Payer: Medicare HMO | Admitting: General Practice

## 2020-01-25 ENCOUNTER — Telehealth: Payer: Self-pay | Admitting: General Practice

## 2020-01-25 DIAGNOSIS — R42 Dizziness and giddiness: Secondary | ICD-10-CM | POA: Diagnosis not present

## 2020-01-25 DIAGNOSIS — W109XXS Fall (on) (from) unspecified stairs and steps, sequela: Secondary | ICD-10-CM | POA: Diagnosis not present

## 2020-01-25 DIAGNOSIS — Z7901 Long term (current) use of anticoagulants: Secondary | ICD-10-CM | POA: Diagnosis not present

## 2020-01-25 DIAGNOSIS — I4891 Unspecified atrial fibrillation: Secondary | ICD-10-CM | POA: Diagnosis not present

## 2020-01-25 DIAGNOSIS — M8580 Other specified disorders of bone density and structure, unspecified site: Secondary | ICD-10-CM | POA: Diagnosis not present

## 2020-01-25 DIAGNOSIS — S0990XS Unspecified injury of head, sequela: Secondary | ICD-10-CM | POA: Diagnosis not present

## 2020-01-25 DIAGNOSIS — Z9181 History of falling: Secondary | ICD-10-CM | POA: Diagnosis not present

## 2020-01-25 NOTE — Telephone Encounter (Signed)
   Went to chart to check who called pt. Advised appt with Angela Leach has been cancelled since her heart monitor was just sent out 04/27. She said will callback to reschedule when heart monitor is received

## 2020-01-25 NOTE — Telephone Encounter (Signed)
Per Edd Fabian, NP:  Need to reschedule appt because monitor is not completed.  Called pt and left vm stating that her appt for today will be cancelled and I will try to call her again today to get her rescheduled. Will need to f/u in about 2 weeks, when monitor is completed. Informed pt to call our office if she would like at [phone].

## 2020-01-27 ENCOUNTER — Other Ambulatory Visit (INDEPENDENT_AMBULATORY_CARE_PROVIDER_SITE_OTHER): Payer: Medicare HMO

## 2020-01-27 DIAGNOSIS — I495 Sick sinus syndrome: Secondary | ICD-10-CM

## 2020-01-27 DIAGNOSIS — I493 Ventricular premature depolarization: Secondary | ICD-10-CM | POA: Diagnosis not present

## 2020-01-27 DIAGNOSIS — I4891 Unspecified atrial fibrillation: Secondary | ICD-10-CM

## 2020-01-29 DIAGNOSIS — R42 Dizziness and giddiness: Secondary | ICD-10-CM | POA: Diagnosis not present

## 2020-01-29 DIAGNOSIS — S0990XS Unspecified injury of head, sequela: Secondary | ICD-10-CM | POA: Diagnosis not present

## 2020-01-29 DIAGNOSIS — I4891 Unspecified atrial fibrillation: Secondary | ICD-10-CM | POA: Diagnosis not present

## 2020-01-29 DIAGNOSIS — M8580 Other specified disorders of bone density and structure, unspecified site: Secondary | ICD-10-CM | POA: Diagnosis not present

## 2020-01-29 DIAGNOSIS — Z9181 History of falling: Secondary | ICD-10-CM | POA: Diagnosis not present

## 2020-01-29 DIAGNOSIS — Z7901 Long term (current) use of anticoagulants: Secondary | ICD-10-CM | POA: Diagnosis not present

## 2020-01-29 DIAGNOSIS — W109XXS Fall (on) (from) unspecified stairs and steps, sequela: Secondary | ICD-10-CM | POA: Diagnosis not present

## 2020-01-30 DIAGNOSIS — W109XXS Fall (on) (from) unspecified stairs and steps, sequela: Secondary | ICD-10-CM | POA: Diagnosis not present

## 2020-01-30 DIAGNOSIS — R42 Dizziness and giddiness: Secondary | ICD-10-CM | POA: Diagnosis not present

## 2020-01-30 DIAGNOSIS — S0990XS Unspecified injury of head, sequela: Secondary | ICD-10-CM | POA: Diagnosis not present

## 2020-01-30 DIAGNOSIS — I4891 Unspecified atrial fibrillation: Secondary | ICD-10-CM | POA: Diagnosis not present

## 2020-01-30 DIAGNOSIS — Z7901 Long term (current) use of anticoagulants: Secondary | ICD-10-CM | POA: Diagnosis not present

## 2020-01-30 DIAGNOSIS — Z9181 History of falling: Secondary | ICD-10-CM | POA: Diagnosis not present

## 2020-01-30 DIAGNOSIS — M8580 Other specified disorders of bone density and structure, unspecified site: Secondary | ICD-10-CM | POA: Diagnosis not present

## 2020-01-31 DIAGNOSIS — I4891 Unspecified atrial fibrillation: Secondary | ICD-10-CM | POA: Diagnosis not present

## 2020-01-31 DIAGNOSIS — Z9181 History of falling: Secondary | ICD-10-CM | POA: Diagnosis not present

## 2020-01-31 DIAGNOSIS — M8580 Other specified disorders of bone density and structure, unspecified site: Secondary | ICD-10-CM | POA: Diagnosis not present

## 2020-01-31 DIAGNOSIS — Z7901 Long term (current) use of anticoagulants: Secondary | ICD-10-CM | POA: Diagnosis not present

## 2020-01-31 DIAGNOSIS — S0990XS Unspecified injury of head, sequela: Secondary | ICD-10-CM | POA: Diagnosis not present

## 2020-01-31 DIAGNOSIS — W109XXS Fall (on) (from) unspecified stairs and steps, sequela: Secondary | ICD-10-CM | POA: Diagnosis not present

## 2020-01-31 DIAGNOSIS — R42 Dizziness and giddiness: Secondary | ICD-10-CM | POA: Diagnosis not present

## 2020-02-05 ENCOUNTER — Ambulatory Visit: Payer: Medicare HMO | Admitting: Cardiology

## 2020-02-05 DIAGNOSIS — S0990XS Unspecified injury of head, sequela: Secondary | ICD-10-CM | POA: Diagnosis not present

## 2020-02-05 DIAGNOSIS — Z7901 Long term (current) use of anticoagulants: Secondary | ICD-10-CM | POA: Diagnosis not present

## 2020-02-05 DIAGNOSIS — Z9181 History of falling: Secondary | ICD-10-CM | POA: Diagnosis not present

## 2020-02-05 DIAGNOSIS — W109XXS Fall (on) (from) unspecified stairs and steps, sequela: Secondary | ICD-10-CM | POA: Diagnosis not present

## 2020-02-05 DIAGNOSIS — R42 Dizziness and giddiness: Secondary | ICD-10-CM | POA: Diagnosis not present

## 2020-02-05 DIAGNOSIS — I4891 Unspecified atrial fibrillation: Secondary | ICD-10-CM | POA: Diagnosis not present

## 2020-02-05 DIAGNOSIS — M8580 Other specified disorders of bone density and structure, unspecified site: Secondary | ICD-10-CM | POA: Diagnosis not present

## 2020-02-08 DIAGNOSIS — R42 Dizziness and giddiness: Secondary | ICD-10-CM | POA: Diagnosis not present

## 2020-02-08 DIAGNOSIS — S0990XS Unspecified injury of head, sequela: Secondary | ICD-10-CM | POA: Diagnosis not present

## 2020-02-08 DIAGNOSIS — M8580 Other specified disorders of bone density and structure, unspecified site: Secondary | ICD-10-CM | POA: Diagnosis not present

## 2020-02-08 DIAGNOSIS — Z7901 Long term (current) use of anticoagulants: Secondary | ICD-10-CM | POA: Diagnosis not present

## 2020-02-08 DIAGNOSIS — I4891 Unspecified atrial fibrillation: Secondary | ICD-10-CM | POA: Diagnosis not present

## 2020-02-08 DIAGNOSIS — W109XXS Fall (on) (from) unspecified stairs and steps, sequela: Secondary | ICD-10-CM | POA: Diagnosis not present

## 2020-02-08 DIAGNOSIS — Z9181 History of falling: Secondary | ICD-10-CM | POA: Diagnosis not present

## 2020-02-09 ENCOUNTER — Ambulatory Visit: Payer: Medicare HMO | Admitting: Cardiovascular Disease

## 2020-02-12 ENCOUNTER — Ambulatory Visit: Payer: Medicare HMO | Attending: Internal Medicine

## 2020-02-12 ENCOUNTER — Ambulatory Visit: Payer: Medicare HMO

## 2020-02-12 DIAGNOSIS — Z23 Encounter for immunization: Secondary | ICD-10-CM

## 2020-02-12 DIAGNOSIS — Z7901 Long term (current) use of anticoagulants: Secondary | ICD-10-CM | POA: Diagnosis not present

## 2020-02-12 DIAGNOSIS — M8580 Other specified disorders of bone density and structure, unspecified site: Secondary | ICD-10-CM | POA: Diagnosis not present

## 2020-02-12 DIAGNOSIS — S0990XS Unspecified injury of head, sequela: Secondary | ICD-10-CM | POA: Diagnosis not present

## 2020-02-12 DIAGNOSIS — D6869 Other thrombophilia: Secondary | ICD-10-CM | POA: Diagnosis not present

## 2020-02-12 DIAGNOSIS — I4891 Unspecified atrial fibrillation: Secondary | ICD-10-CM | POA: Diagnosis not present

## 2020-02-12 DIAGNOSIS — Z9181 History of falling: Secondary | ICD-10-CM | POA: Diagnosis not present

## 2020-02-12 DIAGNOSIS — R42 Dizziness and giddiness: Secondary | ICD-10-CM | POA: Diagnosis not present

## 2020-02-12 DIAGNOSIS — W109XXS Fall (on) (from) unspecified stairs and steps, sequela: Secondary | ICD-10-CM | POA: Diagnosis not present

## 2020-02-12 NOTE — Progress Notes (Signed)
   Covid-19 Vaccination Clinic  Name:  Angela Leach    MRN: [id number] DOB: Feb 23, 1933  02/12/2020  Angela Leach was observed post Covid-19 immunization for 15 minutes without incident. She was provided with Vaccine Information Sheet and instruction to access the V-Safe system.   Angela Leach was instructed to call 911 with any severe reactions post vaccine: Marland Kitchen Difficulty breathing  . Swelling of face and throat  . A fast heartbeat  . A bad rash all over body  . Dizziness and weakness   Immunizations Administered    Name Date Dose VIS Date Route   Pfizer COVID-19 Vaccine 02/12/2020 11:56 AM 0.3 mL 11/22/2018 Intramuscular   Manufacturer: [employer], [employer]   Lot: [id number]   NDC: [address]-4

## 2020-02-14 DIAGNOSIS — Z7901 Long term (current) use of anticoagulants: Secondary | ICD-10-CM | POA: Diagnosis not present

## 2020-02-14 DIAGNOSIS — Z9181 History of falling: Secondary | ICD-10-CM | POA: Diagnosis not present

## 2020-02-14 DIAGNOSIS — R42 Dizziness and giddiness: Secondary | ICD-10-CM | POA: Diagnosis not present

## 2020-02-14 DIAGNOSIS — I4891 Unspecified atrial fibrillation: Secondary | ICD-10-CM | POA: Diagnosis not present

## 2020-02-14 DIAGNOSIS — M8580 Other specified disorders of bone density and structure, unspecified site: Secondary | ICD-10-CM | POA: Diagnosis not present

## 2020-02-14 DIAGNOSIS — W109XXS Fall (on) (from) unspecified stairs and steps, sequela: Secondary | ICD-10-CM | POA: Diagnosis not present

## 2020-02-14 DIAGNOSIS — S0990XS Unspecified injury of head, sequela: Secondary | ICD-10-CM | POA: Diagnosis not present

## 2020-02-16 DIAGNOSIS — W109XXS Fall (on) (from) unspecified stairs and steps, sequela: Secondary | ICD-10-CM | POA: Diagnosis not present

## 2020-02-16 DIAGNOSIS — I495 Sick sinus syndrome: Secondary | ICD-10-CM | POA: Diagnosis not present

## 2020-02-16 DIAGNOSIS — S0990XS Unspecified injury of head, sequela: Secondary | ICD-10-CM | POA: Diagnosis not present

## 2020-02-16 DIAGNOSIS — I493 Ventricular premature depolarization: Secondary | ICD-10-CM | POA: Diagnosis not present

## 2020-02-16 DIAGNOSIS — Z9181 History of falling: Secondary | ICD-10-CM | POA: Diagnosis not present

## 2020-02-16 DIAGNOSIS — Z7901 Long term (current) use of anticoagulants: Secondary | ICD-10-CM | POA: Diagnosis not present

## 2020-02-16 DIAGNOSIS — R42 Dizziness and giddiness: Secondary | ICD-10-CM | POA: Diagnosis not present

## 2020-02-16 DIAGNOSIS — I4891 Unspecified atrial fibrillation: Secondary | ICD-10-CM | POA: Diagnosis not present

## 2020-02-16 DIAGNOSIS — M8580 Other specified disorders of bone density and structure, unspecified site: Secondary | ICD-10-CM | POA: Diagnosis not present

## 2020-02-19 ENCOUNTER — Ambulatory Visit
Admission: RE | Admit: 2020-02-19 | Discharge: 2020-02-19 | Disposition: A | Discharge status: Home / Self Care | Payer: Medicare HMO | Source: Ambulatory Visit | Attending: Family Medicine | Admitting: Family Medicine

## 2020-02-19 ENCOUNTER — Other Ambulatory Visit: Payer: Self-pay | Admitting: Family Medicine

## 2020-02-19 DIAGNOSIS — M542 Cervicalgia: Secondary | ICD-10-CM | POA: Diagnosis not present

## 2020-02-19 DIAGNOSIS — Z9181 History of falling: Secondary | ICD-10-CM | POA: Diagnosis not present

## 2020-02-19 DIAGNOSIS — Z7901 Long term (current) use of anticoagulants: Secondary | ICD-10-CM | POA: Diagnosis not present

## 2020-02-19 DIAGNOSIS — I4891 Unspecified atrial fibrillation: Secondary | ICD-10-CM | POA: Diagnosis not present

## 2020-02-19 DIAGNOSIS — R42 Dizziness and giddiness: Secondary | ICD-10-CM | POA: Diagnosis not present

## 2020-02-19 DIAGNOSIS — W109XXS Fall (on) (from) unspecified stairs and steps, sequela: Secondary | ICD-10-CM | POA: Diagnosis not present

## 2020-02-19 DIAGNOSIS — M8580 Other specified disorders of bone density and structure, unspecified site: Secondary | ICD-10-CM | POA: Diagnosis not present

## 2020-02-19 DIAGNOSIS — S0990XS Unspecified injury of head, sequela: Secondary | ICD-10-CM | POA: Diagnosis not present

## 2020-02-20 DIAGNOSIS — Z7901 Long term (current) use of anticoagulants: Secondary | ICD-10-CM | POA: Diagnosis not present

## 2020-02-20 DIAGNOSIS — I4891 Unspecified atrial fibrillation: Secondary | ICD-10-CM | POA: Diagnosis not present

## 2020-02-20 DIAGNOSIS — W109XXS Fall (on) (from) unspecified stairs and steps, sequela: Secondary | ICD-10-CM | POA: Diagnosis not present

## 2020-02-20 DIAGNOSIS — S0990XS Unspecified injury of head, sequela: Secondary | ICD-10-CM | POA: Diagnosis not present

## 2020-02-20 DIAGNOSIS — M8580 Other specified disorders of bone density and structure, unspecified site: Secondary | ICD-10-CM | POA: Diagnosis not present

## 2020-02-20 DIAGNOSIS — R42 Dizziness and giddiness: Secondary | ICD-10-CM | POA: Diagnosis not present

## 2020-02-20 DIAGNOSIS — Z9181 History of falling: Secondary | ICD-10-CM | POA: Diagnosis not present

## 2020-02-21 DIAGNOSIS — S0990XS Unspecified injury of head, sequela: Secondary | ICD-10-CM | POA: Diagnosis not present

## 2020-02-21 DIAGNOSIS — W109XXS Fall (on) (from) unspecified stairs and steps, sequela: Secondary | ICD-10-CM | POA: Diagnosis not present

## 2020-02-21 DIAGNOSIS — M8580 Other specified disorders of bone density and structure, unspecified site: Secondary | ICD-10-CM | POA: Diagnosis not present

## 2020-02-21 DIAGNOSIS — Z9181 History of falling: Secondary | ICD-10-CM | POA: Diagnosis not present

## 2020-02-21 DIAGNOSIS — Z7901 Long term (current) use of anticoagulants: Secondary | ICD-10-CM | POA: Diagnosis not present

## 2020-02-21 DIAGNOSIS — R42 Dizziness and giddiness: Secondary | ICD-10-CM | POA: Diagnosis not present

## 2020-02-21 DIAGNOSIS — I4891 Unspecified atrial fibrillation: Secondary | ICD-10-CM | POA: Diagnosis not present

## 2020-02-22 DIAGNOSIS — I4891 Unspecified atrial fibrillation: Secondary | ICD-10-CM | POA: Diagnosis not present

## 2020-02-22 DIAGNOSIS — R42 Dizziness and giddiness: Secondary | ICD-10-CM | POA: Diagnosis not present

## 2020-02-22 DIAGNOSIS — W109XXS Fall (on) (from) unspecified stairs and steps, sequela: Secondary | ICD-10-CM | POA: Diagnosis not present

## 2020-02-22 DIAGNOSIS — Z7901 Long term (current) use of anticoagulants: Secondary | ICD-10-CM | POA: Diagnosis not present

## 2020-02-22 DIAGNOSIS — Z9181 History of falling: Secondary | ICD-10-CM | POA: Diagnosis not present

## 2020-02-22 DIAGNOSIS — S0990XS Unspecified injury of head, sequela: Secondary | ICD-10-CM | POA: Diagnosis not present

## 2020-02-22 DIAGNOSIS — M8580 Other specified disorders of bone density and structure, unspecified site: Secondary | ICD-10-CM | POA: Diagnosis not present

## 2020-02-27 DIAGNOSIS — I4891 Unspecified atrial fibrillation: Secondary | ICD-10-CM | POA: Diagnosis not present

## 2020-02-27 DIAGNOSIS — R42 Dizziness and giddiness: Secondary | ICD-10-CM | POA: Diagnosis not present

## 2020-02-27 DIAGNOSIS — W109XXS Fall (on) (from) unspecified stairs and steps, sequela: Secondary | ICD-10-CM | POA: Diagnosis not present

## 2020-02-27 DIAGNOSIS — Z7901 Long term (current) use of anticoagulants: Secondary | ICD-10-CM | POA: Diagnosis not present

## 2020-02-27 DIAGNOSIS — Z9181 History of falling: Secondary | ICD-10-CM | POA: Diagnosis not present

## 2020-02-27 DIAGNOSIS — S0990XS Unspecified injury of head, sequela: Secondary | ICD-10-CM | POA: Diagnosis not present

## 2020-02-27 DIAGNOSIS — M8580 Other specified disorders of bone density and structure, unspecified site: Secondary | ICD-10-CM | POA: Diagnosis not present

## 2020-02-28 ENCOUNTER — Ambulatory Visit (INDEPENDENT_AMBULATORY_CARE_PROVIDER_SITE_OTHER): Payer: Medicare HMO | Admitting: General Practice

## 2020-02-28 ENCOUNTER — Encounter: Payer: Self-pay | Admitting: General Practice

## 2020-02-28 ENCOUNTER — Other Ambulatory Visit: Payer: Self-pay

## 2020-02-28 VITALS — BP 110/80 | HR 53 | Temp 98.2°F | Ht 66.0 in

## 2020-02-28 DIAGNOSIS — I4891 Unspecified atrial fibrillation: Secondary | ICD-10-CM | POA: Diagnosis not present

## 2020-02-28 DIAGNOSIS — R079 Chest pain, unspecified: Secondary | ICD-10-CM | POA: Diagnosis not present

## 2020-02-28 DIAGNOSIS — M8580 Other specified disorders of bone density and structure, unspecified site: Secondary | ICD-10-CM | POA: Diagnosis not present

## 2020-02-28 DIAGNOSIS — W109XXS Fall (on) (from) unspecified stairs and steps, sequela: Secondary | ICD-10-CM | POA: Diagnosis not present

## 2020-02-28 DIAGNOSIS — Z7901 Long term (current) use of anticoagulants: Secondary | ICD-10-CM | POA: Diagnosis not present

## 2020-02-28 DIAGNOSIS — Z9181 History of falling: Secondary | ICD-10-CM | POA: Diagnosis not present

## 2020-02-28 DIAGNOSIS — S0990XS Unspecified injury of head, sequela: Secondary | ICD-10-CM | POA: Diagnosis not present

## 2020-02-28 DIAGNOSIS — R42 Dizziness and giddiness: Secondary | ICD-10-CM | POA: Diagnosis not present

## 2020-02-28 NOTE — Patient Instructions (Signed)
Medication Instructions:  The current medical regimen is effective;  continue present plan and medications as directed. Please refer to the Current Medication list given to you today. *If you need a refill on your cardiac medications before your next appointment, please call your pharmacy*  Special Instructions MAINTAIN PHYSICAL ACTIVITY AS TOLERATED  PLEASE READ AND FOLLOW SALTY 6-ATTACHED  Follow-Up: Your next appointment:  3 month(s)  In Person with Chilton Si, MD  At [facility], you and your health needs are our priority.  As part of our continuing mission to provide you with exceptional heart care, we have created designated Provider Care Teams.  These Care Teams include your primary Cardiologist (physician) and Advanced Practice Providers (APPs -  Physician Assistants and Nurse Practitioners) who all work together to provide you with the care you need, when you need it.  We recommend signing up for the patient portal called "MyChart".  Sign up information is provided on this After Visit Summary.  MyChart is used to connect with patients for Virtual Visits (Telemedicine).  Patients are able to view lab/test results, encounter notes, upcoming appointments, etc.  Non-urgent messages can be sent to your provider as well.   To learn more about what you can do with MyChart, go to [email or web address].

## 2020-02-28 NOTE — Progress Notes (Signed)
Cardiology Clinic Note   Patient Name: Angela Leach Date of Encounter: 02/28/2020  Primary Care Provider:  Clovis Riley, L.August Saucer, MD Primary Cardiologist:  Chilton Si, MD  Patient Profile    Claudell Leach. [name] 86 year old female presents today for a review of her cardiac event monitor.  Past Medical History    Past Medical History:  Diagnosis Date  . PVC (premature ventricular contraction) 01/11/2020  . 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 No pertinent surgical history.  Allergies  Allergies  Allergen Reactions  . Penicillins     Did it involve swelling of the face/tongue/throat, SOB, or low BP? Y Did it involve sudden or severe rash/hives, skin peeling, or any reaction on the inside of your mouth or nose? Y Did you need to seek medical attention at a hospital or doctor's office? N When did it last happen?Over 30 years If all above answers are "NO", may proceed with cephalosporin use.     History of Present Illness    [name] has a PMH of atrial fibrillation with RVR, PVCs, tachybradycardia syndrome, mild renal insufficiency, and dizziness.  She was seen by Dr. Duke Salvia on 01/11/2020 for an evaluation of her atrial fibrillation.  She was seen in the emergency department on 2/21 after she had fallen down stairs.  Her fall was mechanical fall and she was noted to have fallen face down.  In the emergency department she was found to have atrial fibrillation with RVR.  She spontaneously converted to sinus rhythm.  She was started on Eliquis at that time.  An echocardiogram showed an LVEF of 55-60% with grade 1 diastolic dysfunction.  She indicated that she noticed occasional heart racing.  However, she felt this was just due to overexertion.  She stated that recently her and her daughter had been renovating her home.  She had been painting walls and becoming extremely fatigued.  She noted that she had occasional chest pressure but attributed the  pressure to her old age.  Pain was described as substernal and would sometimes radiate to her back.  She indicated that the pain would make her short of breath and nauseous.  She denied lower extremity edema orthopnea or PND.  She wore a 3-day cardiac event monitor that showed  predominantly underlying rhythm was Sinus Rhythm. First Degree AV Block,Atrial Fibrillation occurred (6% burden), ranging from 78-177 bpm (avg of 118 bpm), the longest lasting 3 hours 5 mins with an avg rate of 114 bpm. Isolated SVEs were occasional (3.3%, 16392), and ventricular bigeminy/trigeminy.  She states she occasionally notices palpitations but they are brief and resolve with rest.  She states she has continued to be physically active walking 15 to 20 minutes daily and staying active with her house renovation.  She states she is nearly done in the house is ready to go on the market.  She presents with her daughter and her daughter states she has been doing well.  I will give her the salty 6 diet sheet, have her maintain her physical activity, and have her follow-up with Dr. Duke Salvia in 3 months.  Today she denies chest pain, shortness of breath, lower extremity edema, fatigue, palpitations, melena, hematuria, hemoptysis, diaphoresis, weakness, presyncope, syncope, orthopnea, and PND.    Home Medications    Prior to Admission medications   Medication Sig Start Date End Date Taking? Authorizing Provider  apixaban (ELIQUIS) 5 MG TABS tablet Take 1 tablet (5 mg total) by mouth 2 (two) times daily. 11/23/19  Patrecia Pour, MD  CALCIUM-VITAMIN D PO Take 1 tablet by mouth every evening.    [provider]  Misc Natural Products (WHITE WILLOW BARK PO) Take 1 tablet by mouth daily.    [provider]  Multiple Vitamins-Minerals (MULTIVITAMIN ADULTS) TABS Take 1 tablet by mouth daily.    [provider]    Family History    Family History  Problem Relation Age of Onset  . Rheumatic fever Sister     . Stroke Brother    She indicated that the status of her sister is unknown. She indicated that the status of her brother is unknown.  Social History    Social History   Socioeconomic History  . Marital status: Married    Spouse name: Not on file  . Number of children: Not on file  . Years of education: Not on file  . Highest education level: Not on file  Occupational History  . Not on file  Tobacco Use  . Smoking status: Never Smoker  . Smokeless tobacco: Never Used  Substance and Sexual Activity  . Alcohol use: Not on file  . Drug use: Not on file  . Sexual activity: Not on file  Other Topics Concern  . Not on file  Social History Narrative  . Not on file   Social Determinants of Health   Financial Resource Strain:   . Difficulty of Paying Living Expenses:   Food Insecurity:   . Worried About Charity fundraiser in the Last Year:   . Arboriculturist in the Last Year:   Transportation Needs:   . Film/video editor (Medical):   Marland Kitchen Lack of Transportation (Non-Medical):   Physical Activity:   . Days of Exercise per Week:   . Minutes of Exercise per Session:   Stress:   . Feeling of Stress :   Social Connections:   . Frequency of Communication with Friends and Family:   . Frequency of Social Gatherings with Friends and Family:   . Attends Religious Services:   . Active Member of Clubs or Organizations:   . Attends Archivist Meetings:   Marland Kitchen Marital Status:   Intimate Partner Violence:   . Fear of Current or Ex-Partner:   . Emotionally Abused:   Marland Kitchen Physically Abused:   . Sexually Abused:      Review of Systems    General:  No chills, fever, night sweats or weight changes.  Cardiovascular:  No chest pain, dyspnea on exertion, edema, orthopnea, palpitations, paroxysmal nocturnal dyspnea. Dermatological: No rash, lesions/masses Respiratory: No cough, dyspnea Urologic: No hematuria, dysuria Abdominal:   No nausea, vomiting, diarrhea, bright red blood  per rectum, melena, or hematemesis Neurologic:  No visual changes, wkns, changes in mental status. All other systems reviewed and are otherwise negative except as noted above.  Physical Exam    VS:  BP 110/80 (BP Location: Left Arm, Patient Position: Sitting, Cuff Size: Large)   Pulse (!) 53   Temp 98.2 F (36.8 C)   Ht 5\' 6"  (1.676 m)   BMI 31.99 kg/m  , BMI Body mass index is 31.99 kg/m. GEN: Well nourished, well developed, in no acute distress. HEENT: normal. Neck: Supple, no JVD, carotid bruits, or masses. Cardiac: RRR, no murmurs, rubs, or gallops. No clubbing, cyanosis, edema.  Radials/DP/PT 2+ and equal bilaterally.  Respiratory:  Respirations regular and unlabored, clear to auscultation bilaterally. GI: Soft, nontender, nondistended, BS + x 4. MS: no deformity or  atrophy. Skin: warm and dry, no rash. Neuro:  Strength and sensation are intact. Psych: Normal affect.  Accessory Clinical Findings    ECG personally reviewed by me today-none today.   Echocardiogram 11/22/2019 IMPRESSIONS    1. Left ventricular ejection fraction, by estimation, is 55 to 60%. The  left ventricle has normal function. The left ventricle has no regional  wall motion abnormalities. Left ventricular diastolic parameters are  consistent with age-related delayed  relaxation (normal).  2. Right ventricular systolic function is normal. The right ventricular  size is normal. There is normal pulmonary artery systolic pressure. The  estimated right ventricular systolic pressure is 23.7 mmHg.  3. The mitral valve is degenerative. No evidence of mitral valve  regurgitation. No evidence of mitral stenosis.  4. The aortic valve is tricuspid. Aortic valve regurgitation is not  visualized. No aortic stenosis is present.  5. The inferior vena cava is normal in size with <50% respiratory  variability, suggesting right atrial pressure of 8 mmHg.   Cardiac event monitor 02/01/2020 3-day cardiac event  monitor showed predominantly underlying rhythm was Sinus Rhythm. First Degree AV Block,Atrial Fibrillation occurred (6% burden), ranging from 78-177 bpm (avg of 118 bpm), the longest lasting 3 hours 5 mins with an avg rate of 114 bpm. Isolated SVEs were occasional (3.3%, 16392), and ventricular bigeminy/trigeminy.   Assessment & Plan   1.  Paroxysmal atrial fibrillation-3-day cardiac event monitor showed predominantly underlying rhythm was Sinus Rhythm. First Degree AV Block,Atrial Fibrillation occurred (6% burden), ranging from 78-177 bpm (avg of 118 bpm), the longest lasting 3 hours 5 mins with an avg rate of 114 bpm. Isolated SVEs were occasional (3.3%, 16392), and ventricular bigeminy/trigeminy.  No recurrent falls. Continue apixaban 5 mg twice daily Avoid triggers caffeine, chocolate, EtOH etc. Heart healthy low-sodium diet-salty 6 given Maintain physical activity  Chest pain-no chest pain today.  Sent to emergency department on 01/11/2020 for ischemic evaluation.  High-sensitivity troponins were flat at 7 and 5.  EKG showed normal sinus rhythm with PVCs 60 bpm.  No concern for ACS.  Patient discharged in stable condition. Continue to monitor  Disposition: Follow-up with Dr. Duke Salvia in 3 months.  Thomasene Ripple. Robertha Staples NP-C    02/28/2020, 3:30 PM [facility] [address] Office [phone] Fax [phone]

## 2020-02-29 DIAGNOSIS — R42 Dizziness and giddiness: Secondary | ICD-10-CM | POA: Diagnosis not present

## 2020-02-29 DIAGNOSIS — M8580 Other specified disorders of bone density and structure, unspecified site: Secondary | ICD-10-CM | POA: Diagnosis not present

## 2020-02-29 DIAGNOSIS — S0990XS Unspecified injury of head, sequela: Secondary | ICD-10-CM | POA: Diagnosis not present

## 2020-02-29 DIAGNOSIS — I4891 Unspecified atrial fibrillation: Secondary | ICD-10-CM | POA: Diagnosis not present

## 2020-02-29 DIAGNOSIS — Z7901 Long term (current) use of anticoagulants: Secondary | ICD-10-CM | POA: Diagnosis not present

## 2020-02-29 DIAGNOSIS — Z9181 History of falling: Secondary | ICD-10-CM | POA: Diagnosis not present

## 2020-02-29 DIAGNOSIS — W109XXS Fall (on) (from) unspecified stairs and steps, sequela: Secondary | ICD-10-CM | POA: Diagnosis not present

## 2020-03-01 DIAGNOSIS — Z9181 History of falling: Secondary | ICD-10-CM | POA: Diagnosis not present

## 2020-03-01 DIAGNOSIS — S0990XS Unspecified injury of head, sequela: Secondary | ICD-10-CM | POA: Diagnosis not present

## 2020-03-01 DIAGNOSIS — Z7901 Long term (current) use of anticoagulants: Secondary | ICD-10-CM | POA: Diagnosis not present

## 2020-03-01 DIAGNOSIS — I4891 Unspecified atrial fibrillation: Secondary | ICD-10-CM | POA: Diagnosis not present

## 2020-03-01 DIAGNOSIS — W109XXS Fall (on) (from) unspecified stairs and steps, sequela: Secondary | ICD-10-CM | POA: Diagnosis not present

## 2020-03-01 DIAGNOSIS — R42 Dizziness and giddiness: Secondary | ICD-10-CM | POA: Diagnosis not present

## 2020-03-01 DIAGNOSIS — M8580 Other specified disorders of bone density and structure, unspecified site: Secondary | ICD-10-CM | POA: Diagnosis not present

## 2020-03-04 DIAGNOSIS — Z9181 History of falling: Secondary | ICD-10-CM | POA: Diagnosis not present

## 2020-03-04 DIAGNOSIS — Z7901 Long term (current) use of anticoagulants: Secondary | ICD-10-CM | POA: Diagnosis not present

## 2020-03-04 DIAGNOSIS — W109XXS Fall (on) (from) unspecified stairs and steps, sequela: Secondary | ICD-10-CM | POA: Diagnosis not present

## 2020-03-04 DIAGNOSIS — S0990XS Unspecified injury of head, sequela: Secondary | ICD-10-CM | POA: Diagnosis not present

## 2020-03-04 DIAGNOSIS — M8580 Other specified disorders of bone density and structure, unspecified site: Secondary | ICD-10-CM | POA: Diagnosis not present

## 2020-03-04 DIAGNOSIS — R42 Dizziness and giddiness: Secondary | ICD-10-CM | POA: Diagnosis not present

## 2020-03-04 DIAGNOSIS — I4891 Unspecified atrial fibrillation: Secondary | ICD-10-CM | POA: Diagnosis not present

## 2020-03-05 DIAGNOSIS — S0990XS Unspecified injury of head, sequela: Secondary | ICD-10-CM | POA: Diagnosis not present

## 2020-03-05 DIAGNOSIS — Z9181 History of falling: Secondary | ICD-10-CM | POA: Diagnosis not present

## 2020-03-05 DIAGNOSIS — M8580 Other specified disorders of bone density and structure, unspecified site: Secondary | ICD-10-CM | POA: Diagnosis not present

## 2020-03-05 DIAGNOSIS — I4891 Unspecified atrial fibrillation: Secondary | ICD-10-CM | POA: Diagnosis not present

## 2020-03-05 DIAGNOSIS — W109XXS Fall (on) (from) unspecified stairs and steps, sequela: Secondary | ICD-10-CM | POA: Diagnosis not present

## 2020-03-05 DIAGNOSIS — R42 Dizziness and giddiness: Secondary | ICD-10-CM | POA: Diagnosis not present

## 2020-03-05 DIAGNOSIS — Z7901 Long term (current) use of anticoagulants: Secondary | ICD-10-CM | POA: Diagnosis not present

## 2020-03-06 DIAGNOSIS — Z9181 History of falling: Secondary | ICD-10-CM | POA: Diagnosis not present

## 2020-03-06 DIAGNOSIS — M8580 Other specified disorders of bone density and structure, unspecified site: Secondary | ICD-10-CM | POA: Diagnosis not present

## 2020-03-06 DIAGNOSIS — Z7901 Long term (current) use of anticoagulants: Secondary | ICD-10-CM | POA: Diagnosis not present

## 2020-03-06 DIAGNOSIS — W109XXS Fall (on) (from) unspecified stairs and steps, sequela: Secondary | ICD-10-CM | POA: Diagnosis not present

## 2020-03-06 DIAGNOSIS — S0990XS Unspecified injury of head, sequela: Secondary | ICD-10-CM | POA: Diagnosis not present

## 2020-03-06 DIAGNOSIS — R42 Dizziness and giddiness: Secondary | ICD-10-CM | POA: Diagnosis not present

## 2020-03-06 DIAGNOSIS — I4891 Unspecified atrial fibrillation: Secondary | ICD-10-CM | POA: Diagnosis not present

## 2020-03-08 DIAGNOSIS — Z9181 History of falling: Secondary | ICD-10-CM | POA: Diagnosis not present

## 2020-03-08 DIAGNOSIS — M8580 Other specified disorders of bone density and structure, unspecified site: Secondary | ICD-10-CM | POA: Diagnosis not present

## 2020-03-08 DIAGNOSIS — R42 Dizziness and giddiness: Secondary | ICD-10-CM | POA: Diagnosis not present

## 2020-03-08 DIAGNOSIS — W109XXS Fall (on) (from) unspecified stairs and steps, sequela: Secondary | ICD-10-CM | POA: Diagnosis not present

## 2020-03-08 DIAGNOSIS — I4891 Unspecified atrial fibrillation: Secondary | ICD-10-CM | POA: Diagnosis not present

## 2020-03-08 DIAGNOSIS — S0990XS Unspecified injury of head, sequela: Secondary | ICD-10-CM | POA: Diagnosis not present

## 2020-03-08 DIAGNOSIS — Z7901 Long term (current) use of anticoagulants: Secondary | ICD-10-CM | POA: Diagnosis not present

## 2020-03-11 DIAGNOSIS — Z7901 Long term (current) use of anticoagulants: Secondary | ICD-10-CM | POA: Diagnosis not present

## 2020-03-11 DIAGNOSIS — S0990XS Unspecified injury of head, sequela: Secondary | ICD-10-CM | POA: Diagnosis not present

## 2020-03-11 DIAGNOSIS — Z9181 History of falling: Secondary | ICD-10-CM | POA: Diagnosis not present

## 2020-03-11 DIAGNOSIS — W109XXS Fall (on) (from) unspecified stairs and steps, sequela: Secondary | ICD-10-CM | POA: Diagnosis not present

## 2020-03-11 DIAGNOSIS — R42 Dizziness and giddiness: Secondary | ICD-10-CM | POA: Diagnosis not present

## 2020-03-11 DIAGNOSIS — M8580 Other specified disorders of bone density and structure, unspecified site: Secondary | ICD-10-CM | POA: Diagnosis not present

## 2020-03-11 DIAGNOSIS — I4891 Unspecified atrial fibrillation: Secondary | ICD-10-CM | POA: Diagnosis not present

## 2020-03-13 DIAGNOSIS — I4891 Unspecified atrial fibrillation: Secondary | ICD-10-CM | POA: Diagnosis not present

## 2020-03-13 DIAGNOSIS — M8580 Other specified disorders of bone density and structure, unspecified site: Secondary | ICD-10-CM | POA: Diagnosis not present

## 2020-03-13 DIAGNOSIS — S0990XS Unspecified injury of head, sequela: Secondary | ICD-10-CM | POA: Diagnosis not present

## 2020-03-13 DIAGNOSIS — R42 Dizziness and giddiness: Secondary | ICD-10-CM | POA: Diagnosis not present

## 2020-03-13 DIAGNOSIS — Z9181 History of falling: Secondary | ICD-10-CM | POA: Diagnosis not present

## 2020-03-13 DIAGNOSIS — W109XXS Fall (on) (from) unspecified stairs and steps, sequela: Secondary | ICD-10-CM | POA: Diagnosis not present

## 2020-03-13 DIAGNOSIS — Z7901 Long term (current) use of anticoagulants: Secondary | ICD-10-CM | POA: Diagnosis not present

## 2020-03-18 DIAGNOSIS — S0990XS Unspecified injury of head, sequela: Secondary | ICD-10-CM | POA: Diagnosis not present

## 2020-03-18 DIAGNOSIS — M8580 Other specified disorders of bone density and structure, unspecified site: Secondary | ICD-10-CM | POA: Diagnosis not present

## 2020-03-18 DIAGNOSIS — Z7901 Long term (current) use of anticoagulants: Secondary | ICD-10-CM | POA: Diagnosis not present

## 2020-03-18 DIAGNOSIS — Z9181 History of falling: Secondary | ICD-10-CM | POA: Diagnosis not present

## 2020-03-18 DIAGNOSIS — W109XXS Fall (on) (from) unspecified stairs and steps, sequela: Secondary | ICD-10-CM | POA: Diagnosis not present

## 2020-03-18 DIAGNOSIS — R42 Dizziness and giddiness: Secondary | ICD-10-CM | POA: Diagnosis not present

## 2020-03-18 DIAGNOSIS — I4891 Unspecified atrial fibrillation: Secondary | ICD-10-CM | POA: Diagnosis not present

## 2020-03-20 DIAGNOSIS — Z7901 Long term (current) use of anticoagulants: Secondary | ICD-10-CM | POA: Diagnosis not present

## 2020-03-20 DIAGNOSIS — R42 Dizziness and giddiness: Secondary | ICD-10-CM | POA: Diagnosis not present

## 2020-03-20 DIAGNOSIS — I4891 Unspecified atrial fibrillation: Secondary | ICD-10-CM | POA: Diagnosis not present

## 2020-03-20 DIAGNOSIS — Z9181 History of falling: Secondary | ICD-10-CM | POA: Diagnosis not present

## 2020-03-20 DIAGNOSIS — M8580 Other specified disorders of bone density and structure, unspecified site: Secondary | ICD-10-CM | POA: Diagnosis not present

## 2020-03-20 DIAGNOSIS — S0990XS Unspecified injury of head, sequela: Secondary | ICD-10-CM | POA: Diagnosis not present

## 2020-03-20 DIAGNOSIS — W109XXS Fall (on) (from) unspecified stairs and steps, sequela: Secondary | ICD-10-CM | POA: Diagnosis not present

## 2020-03-28 DIAGNOSIS — R69 Illness, unspecified: Secondary | ICD-10-CM | POA: Diagnosis not present

## 2020-04-02 DIAGNOSIS — I4891 Unspecified atrial fibrillation: Secondary | ICD-10-CM | POA: Diagnosis not present

## 2020-04-02 DIAGNOSIS — R42 Dizziness and giddiness: Secondary | ICD-10-CM | POA: Diagnosis not present

## 2020-04-02 DIAGNOSIS — Z9181 History of falling: Secondary | ICD-10-CM | POA: Diagnosis not present

## 2020-04-02 DIAGNOSIS — Z7901 Long term (current) use of anticoagulants: Secondary | ICD-10-CM | POA: Diagnosis not present

## 2020-04-02 DIAGNOSIS — M8580 Other specified disorders of bone density and structure, unspecified site: Secondary | ICD-10-CM | POA: Diagnosis not present

## 2020-04-02 DIAGNOSIS — W109XXS Fall (on) (from) unspecified stairs and steps, sequela: Secondary | ICD-10-CM | POA: Diagnosis not present

## 2020-04-02 DIAGNOSIS — S0990XS Unspecified injury of head, sequela: Secondary | ICD-10-CM | POA: Diagnosis not present

## 2020-04-04 DIAGNOSIS — M8580 Other specified disorders of bone density and structure, unspecified site: Secondary | ICD-10-CM | POA: Diagnosis not present

## 2020-04-04 DIAGNOSIS — Z7901 Long term (current) use of anticoagulants: Secondary | ICD-10-CM | POA: Diagnosis not present

## 2020-04-04 DIAGNOSIS — Z9181 History of falling: Secondary | ICD-10-CM | POA: Diagnosis not present

## 2020-04-04 DIAGNOSIS — S0990XS Unspecified injury of head, sequela: Secondary | ICD-10-CM | POA: Diagnosis not present

## 2020-04-04 DIAGNOSIS — R42 Dizziness and giddiness: Secondary | ICD-10-CM | POA: Diagnosis not present

## 2020-04-04 DIAGNOSIS — W109XXS Fall (on) (from) unspecified stairs and steps, sequela: Secondary | ICD-10-CM | POA: Diagnosis not present

## 2020-04-04 DIAGNOSIS — I4891 Unspecified atrial fibrillation: Secondary | ICD-10-CM | POA: Diagnosis not present

## 2020-04-08 DIAGNOSIS — W109XXS Fall (on) (from) unspecified stairs and steps, sequela: Secondary | ICD-10-CM | POA: Diagnosis not present

## 2020-04-08 DIAGNOSIS — Z9181 History of falling: Secondary | ICD-10-CM | POA: Diagnosis not present

## 2020-04-08 DIAGNOSIS — Z7901 Long term (current) use of anticoagulants: Secondary | ICD-10-CM | POA: Diagnosis not present

## 2020-04-08 DIAGNOSIS — M8580 Other specified disorders of bone density and structure, unspecified site: Secondary | ICD-10-CM | POA: Diagnosis not present

## 2020-04-08 DIAGNOSIS — I4891 Unspecified atrial fibrillation: Secondary | ICD-10-CM | POA: Diagnosis not present

## 2020-04-08 DIAGNOSIS — R42 Dizziness and giddiness: Secondary | ICD-10-CM | POA: Diagnosis not present

## 2020-04-08 DIAGNOSIS — S0990XS Unspecified injury of head, sequela: Secondary | ICD-10-CM | POA: Diagnosis not present

## 2020-05-16 DIAGNOSIS — R69 Illness, unspecified: Secondary | ICD-10-CM | POA: Diagnosis not present

## 2020-06-05 ENCOUNTER — Ambulatory Visit (INDEPENDENT_AMBULATORY_CARE_PROVIDER_SITE_OTHER): Payer: Medicare HMO | Admitting: Cardiovascular Disease

## 2020-06-05 ENCOUNTER — Encounter: Payer: Self-pay | Admitting: Cardiovascular Disease

## 2020-06-05 ENCOUNTER — Other Ambulatory Visit: Payer: Self-pay

## 2020-06-05 VITALS — BP 122/73 | HR 64 | Ht 66.0 in | Wt 199.2 lb

## 2020-06-05 DIAGNOSIS — I493 Ventricular premature depolarization: Secondary | ICD-10-CM

## 2020-06-05 DIAGNOSIS — I480 Paroxysmal atrial fibrillation: Secondary | ICD-10-CM | POA: Diagnosis not present

## 2020-06-05 DIAGNOSIS — I495 Sick sinus syndrome: Secondary | ICD-10-CM

## 2020-06-05 DIAGNOSIS — I4891 Unspecified atrial fibrillation: Secondary | ICD-10-CM | POA: Diagnosis not present

## 2020-06-05 DIAGNOSIS — Z13220 Encounter for screening for lipoid disorders: Secondary | ICD-10-CM | POA: Diagnosis not present

## 2020-06-05 NOTE — Patient Instructions (Addendum)
Medication Instructions:  STRONGLY CONSIDER GOING BACK ON THE ELIQUIS   *If you need a refill on your cardiac medications before your next appointment, please call your pharmacy*  Lab Work: LP/CMET/CBC TODAY   If you have labs (blood work) drawn today and your tests are completely normal, you will receive your results only by: Marland Kitchen MyChart Message (if you have MyChart) OR . A paper copy in the mail If you have any lab test that is abnormal or we need to change your treatment, we will call you to review the results.  Testing/Procedures: NONE  Follow-Up: At [facility], you and your health needs are our priority.  As part of our continuing mission to provide you with exceptional heart care, we have created designated Provider Care Teams.  These Care Teams include your primary Cardiologist (physician) and Advanced Practice Providers (APPs -  Physician Assistants and Nurse Practitioners) who all work together to provide you with the care you need, when you need it.  We recommend signing up for the patient portal called "MyChart".  Sign up information is provided on this After Visit Summary.  MyChart is used to connect with patients for Virtual Visits (Telemedicine).  Patients are able to view lab/test results, encounter notes, upcoming appointments, etc.  Non-urgent messages can be sent to your provider as well.   To learn more about what you can do with MyChart, go to [email or web address].    Your next appointment:   12 month(s) You will receive a reminder letter in the mail two months in advance. If you don't receive a letter, please call our office to schedule the follow-up appointment.  The format for your next appointment:   In Person  Provider:   You may see Chilton Si, MD or one of the following Advanced Practice Providers on your designated Care Team:    Corine Shelter, PA-C  [place], [place]  Edd Fabian, [place]

## 2020-06-05 NOTE — Progress Notes (Signed)
Cardiology Office Note   Date:  06/05/2020   ID:  Angela Leach, DOB 06/14/1933, MRN [id number]  PCP:  Angela Leach.Angela Saucer, MD  Cardiologist:   Angela Si, MD   No chief complaint on file.   History of Present Illness: Angela Leach is a 87 y.o. female with paroxysmal  atrial fibrillation, mitral regurgitation here for follow-up.  She was seen in the ED 10/2019 after a fall down the stairs.  It was a mechanical fall and she fell face down.  In the ED she was found to be in atrial fibrillation with rapid ventricular response.  She spontaneously converted to sinus rhythm.  She was started on Eliquis in the hospital.  She had an echo at that time that revealed LVEF 55 to 60% with grade 1 diastolic dysfunction. She reported intermittent heart palpitations and was found to have frequent PVCs. When in sinus rhythm she had bradycardia that prohibited nodal agents. She wore a ZIO monitor that revealed recurrent PVCs at 12.3%. She also had some atrial fibrillation, 6% with average heart rates around 118 bpm. She followed up with Angela Fabian, NP, on 02/2020. At that time she was doing well. Her heart rate was 53 bpm.  Lately she has been feeling well.  She struggles with constant fatigue.  She has been active getting her house together.  She has been hanging doors and sawing.  She feels her heart skipping around but isn't bothered by it.  She mostly notices it at night.  She denies chest pain or pressure.  She had one episode of sharp pain that lasted for a second or two.  She has some swelling in her feet after being up on them all day.  She self discontinued Eliquis because someone told her it would cause her to have a stroke. She is not interested in resuming blood thinners.  Past Medical History:  Diagnosis Date  . PVC (premature ventricular contraction) 01/11/2020  . 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 No pertinent surgical history.   Current Outpatient  Medications  Medication Sig Dispense Refill  . CALCIUM-VITAMIN D PO Take 1 tablet by mouth every evening.    . Misc Natural Products (WHITE WILLOW BARK PO) Take 1 tablet by mouth daily.    . Multiple Vitamins-Minerals (MULTIVITAMIN ADULTS) TABS Take 1 tablet by mouth daily.     No current facility-administered medications for this visit.    Allergies:   Penicillins    Social History:  The patient  reports that she has never smoked. She has never used smokeless tobacco.   Family History:  The patient's family history includes Rheumatic fever in her sister; Stroke in her brother.    ROS:  Please see the history of present illness.   Otherwise, review of systems are positive for none.   All other systems are reviewed and negative.    PHYSICAL EXAM: VS:  BP 122/73   Pulse 64   Ht 5\' 6"  (1.676 m)   Wt 199 lb 3.2 oz (90.4 kg)   SpO2 98%   BMI 32.15 kg/m  , BMI Body mass index is 32.15 kg/m. GENERAL:  Well appearing HEENT:  Pupils equal round and reactive, fundi not visualized, oral mucosa unremarkable NECK:  No jugular venous distention, waveform within normal limits, carotid upstroke brisk and symmetric, no bruits, no thyromegaly LYMPHATICS:  No cervical adenopathy LUNGS:  Wheezes at R base HEART:  RRR.  PMI not displaced or sustained,S1 and S2  within normal limits, no S3, no S4, no clicks, no rubs, no murmurs ABD:  Flat, positive bowel sounds normal in frequency in pitch, no bruits, no rebound, no guarding, no midline pulsatile mass, no hepatomegaly, no splenomegaly EXT:  2 plus pulses throughout, no edema, no cyanosis no clubbing SKIN:  No rashes no nodules NEURO:  Cranial nerves II through XII grossly intact, motor grossly intact throughout PSYCH:  Cognitively intact, oriented to person place and time   EKG:  EKG is not ordered today. The ekg ordered today demonstrates sinus rhythm.  Rate 62 bpm.  PVCs. Inferior TWI  Echo 11/22/19: IMPRESSIONS    1. Left ventricular  ejection fraction, by estimation, is 55 to 60%. The  left ventricle has normal function. The left ventricle has no regional  wall motion abnormalities. Left ventricular diastolic parameters are  consistent with age-related delayed  relaxation (normal).  2. Right ventricular systolic function is normal. The right ventricular  size is normal. There is normal pulmonary artery systolic pressure. The  estimated right ventricular systolic pressure is 23.7 mmHg.  3. The mitral valve is degenerative. No evidence of mitral valve  regurgitation. No evidence of mitral stenosis.  4. The aortic valve is tricuspid. Aortic valve regurgitation is not  visualized. No aortic stenosis is present.  5. The inferior vena cava is normal in size with <50% respiratory  variability, suggesting right atrial pressure of 8 mmHg.   Recent Labs: 11/22/2019: ALT 12; TSH 2.448 01/11/2020: BUN 18; Creatinine, Ser 0.95; Hemoglobin 14.7; Magnesium 2.0; Platelets 178; Potassium 4.1; Sodium 140    Lipid Panel No results found for: CHOL, TRIG, HDL, CHOLHDL, VLDL, LDLCALC, LDLDIRECT    Wt Readings from Last 3 Encounters:  06/05/20 199 lb 3.2 oz (90.4 kg)  01/11/20 198 lb 3.2 oz (89.9 kg)  11/21/19 197 lb 15.6 oz (89.8 kg)      ASSESSMENT AND PLAN:  # PAF:   Angela Leach had an episode of atrial fibrillation in the hospital.  It seems that she may have been in and out of atrial fibrillation even prior to this event.  She is now having frequent PVCs.  Bradycardia prohibits the use of a nodal agent.  Echocardiogram was unremarkable.  It was recommended that she resume taking Eliquis and she declined.  She understands that from a medical and scientific standpoint Eliquis lowers, not increases her risk of stroke.  Check CBC.  # Chest pain:  # PVCs:  Chest pain resolved.  She has been quite active and has no exertional symptoms.  Check fasting lipids and a CMP.    Current medicines are reviewed at length with the  patient today.  The patient does not have concerns regarding medicines.  The following changes have been made:  no change  Labs/ tests ordered today include:   Orders Placed This Encounter  Procedures  . CBC with Differential/Platelet  . Lipid panel  . Comprehensive metabolic panel     Disposition:   FU with APP in 1 year      Signed, Shaquavia Whisonant C. Duke Salvia, MD, [facility]  06/05/2020 3:55 PM     [facility]

## 2020-06-06 LAB — COMPREHENSIVE METABOLIC PANEL
ALT: 15 IU/L (ref 0–32)
AST: 18 IU/L (ref 0–40)
Albumin/Globulin Ratio: 1.3 (ref 1.2–2.2)
Albumin: 4 g/dL (ref 3.6–4.6)
Alkaline Phosphatase: 58 IU/L (ref 48–121)
BUN/Creatinine Ratio: 20 (ref 12–28)
BUN: 16 mg/dL (ref 8–27)
Bilirubin Total: 0.3 mg/dL (ref 0.0–1.2)
CO2: 26 mmol/L (ref 20–29)
Calcium: 9.6 mg/dL (ref 8.7–10.3)
Chloride: 103 mmol/L (ref 96–106)
Creatinine, Ser: 0.82 mg/dL (ref 0.57–1.00)
GFR calc Af Amer: 75 mL/min/{1.73_m2} (ref >59)
GFR calc non Af Amer: 65 mL/min/{1.73_m2} (ref >59)
Globulin, Total: 3 g/dL (ref 1.5–4.5)
Glucose: 88 mg/dL (ref 65–99)
Potassium: 4.7 mmol/L (ref 3.5–5.2)
Sodium: 142 mmol/L (ref 134–144)
Total Protein: 7 g/dL (ref 6.0–8.5)

## 2020-06-06 LAB — CBC WITH DIFFERENTIAL/PLATELET
Basophils Absolute: 0 10*3/uL (ref 0.0–0.2)
Basos: 0 %
EOS (ABSOLUTE): 0.2 10*3/uL (ref 0.0–0.4)
Eos: 2 %
Hematocrit: 46.7 % — ABNORMAL HIGH (ref 34.0–46.6)
Hemoglobin: 15.4 g/dL (ref 11.1–15.9)
Immature Grans (Abs): 0 10*3/uL (ref 0.0–0.1)
Immature Granulocytes: 0 %
Lymphocytes Absolute: 1.9 10*3/uL (ref 0.7–3.1)
Lymphs: 27 %
MCH: 31.7 pg (ref 26.6–33.0)
MCHC: 33 g/dL (ref 31.5–35.7)
MCV: 96 fL (ref 79–97)
Monocytes Absolute: 0.6 10*3/uL (ref 0.1–0.9)
Monocytes: 9 %
Neutrophils Absolute: 4.1 10*3/uL (ref 1.4–7.0)
Neutrophils: 62 %
Platelets: 218 10*3/uL (ref 150–450)
RBC: 4.86 x10E6/uL (ref 3.77–5.28)
RDW: 12.3 % (ref 11.7–15.4)
WBC: 6.8 10*3/uL (ref 3.4–10.8)

## 2020-06-06 LAB — LIPID PANEL
Chol/HDL Ratio: 2.6 ratio (ref 0.0–4.4)
Cholesterol, Total: 174 mg/dL (ref 100–199)
HDL: 68 mg/dL (ref >39)
LDL Chol Calc (NIH): 92 mg/dL (ref 0–99)
Triglycerides: 72 mg/dL (ref 0–149)
VLDL Cholesterol Cal: 14 mg/dL (ref 5–40)

## 2020-06-17 DIAGNOSIS — R69 Illness, unspecified: Secondary | ICD-10-CM | POA: Diagnosis not present

## 2020-08-14 DIAGNOSIS — D6869 Other thrombophilia: Secondary | ICD-10-CM | POA: Diagnosis not present

## 2020-08-14 DIAGNOSIS — I4891 Unspecified atrial fibrillation: Secondary | ICD-10-CM | POA: Diagnosis not present

## 2020-08-15 ENCOUNTER — Ambulatory Visit: Payer: Medicare HMO

## 2021-02-11 ENCOUNTER — Other Ambulatory Visit: Payer: Self-pay | Admitting: Family Medicine

## 2021-02-11 ENCOUNTER — Ambulatory Visit: Payer: Medicare HMO

## 2021-02-11 DIAGNOSIS — Z1231 Encounter for screening mammogram for malignant neoplasm of breast: Secondary | ICD-10-CM

## 2021-02-11 DIAGNOSIS — E669 Obesity, unspecified: Secondary | ICD-10-CM | POA: Diagnosis not present

## 2021-02-11 DIAGNOSIS — R0781 Pleurodynia: Secondary | ICD-10-CM | POA: Diagnosis not present

## 2021-02-11 DIAGNOSIS — I4891 Unspecified atrial fibrillation: Secondary | ICD-10-CM | POA: Diagnosis not present

## 2021-02-11 DIAGNOSIS — Z0000 Encounter for general adult medical examination without abnormal findings: Secondary | ICD-10-CM | POA: Diagnosis not present

## 2021-08-27 IMAGING — CT CT CERVICAL SPINE W/O CM
3 of 4 series · 12 of 33 positions shown, 14 images · non-contrast
Comparison: CT head/cervical spine 02/04/2015

CLINICAL DATA: Head trauma, minor. Facial trauma. Poly trauma,
critical, head/cervical spine injury suspected. Additional history
provided: 86-year-old female via EMS from home status post trip and
fall yesterday at 5 p.m. while walking down outdoor steps, abrasions
to face and left hand from hitting concrete

EXAM:
CT HEAD WITHOUT CONTRAST
CT MAXILLOFACIAL WITHOUT CONTRAST
CT CERVICAL SPINE WITHOUT CONTRAST
TECHNIQUE: Contiguous axial images were obtained from the base of the skull
through the vertex without intravenous contrast. Multidetector CT
imaging of the maxillofacial structures was performed. Multiplanar
CT image reconstructions were also generated. A small metallic BB
was placed on the right temple in order to reliably differentiate
right from left. Multidetector CT imaging of the cervical spine was
performed without intravenous contrast. Multiplanar CT image
reconstructions were also generated.

[Series 5: orthogonal axials · axial · 0.29mm/px · z∈[-321,-205]mm · 4 of 96 slices shown, 5 images]
[im 16/96  soft-tissue]
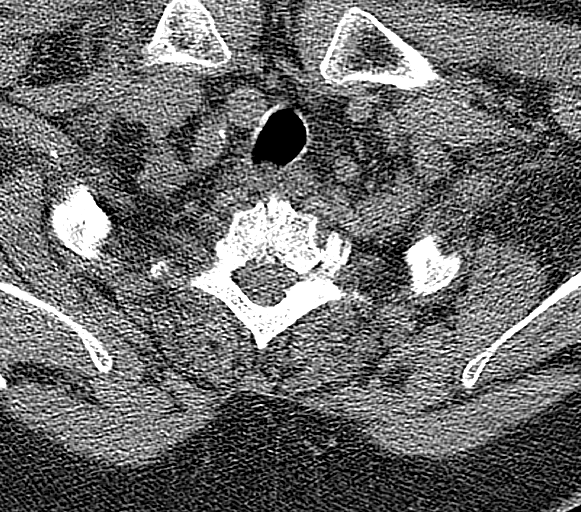
[im 16/96  bone]
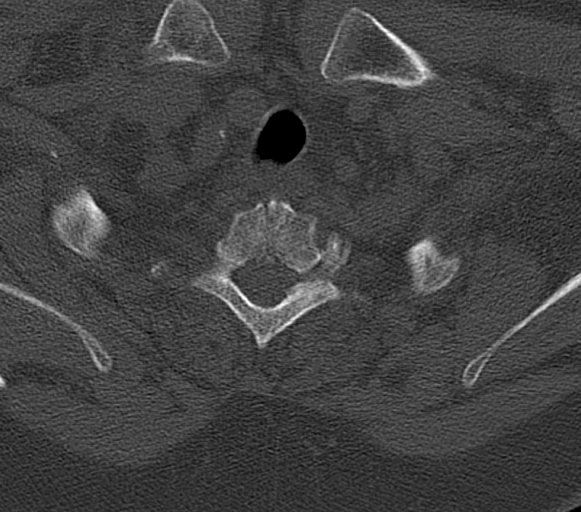
[im 32/96  bone]
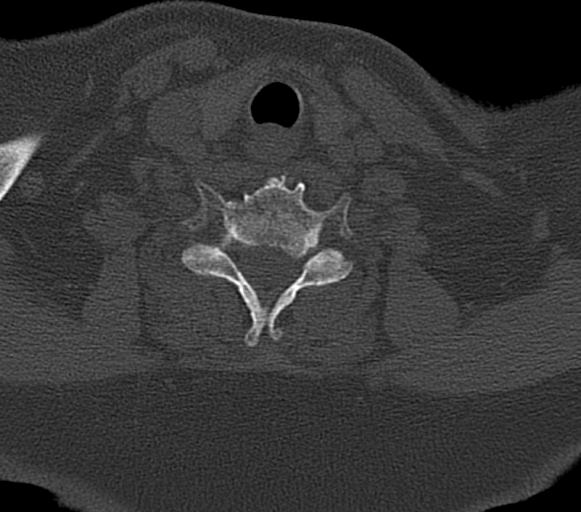
[im 64/96  bone]
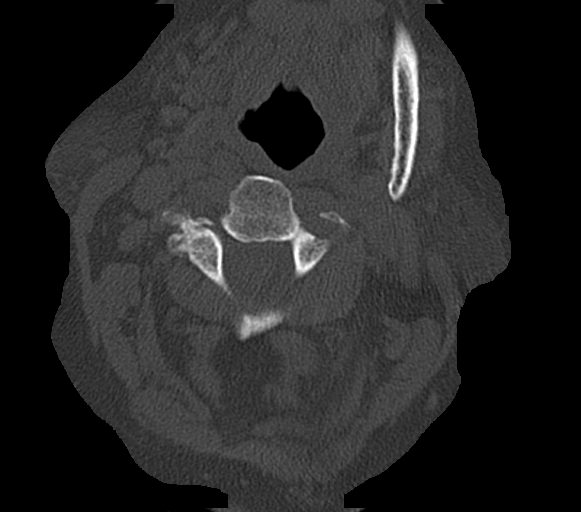
[im 80/96  bone]
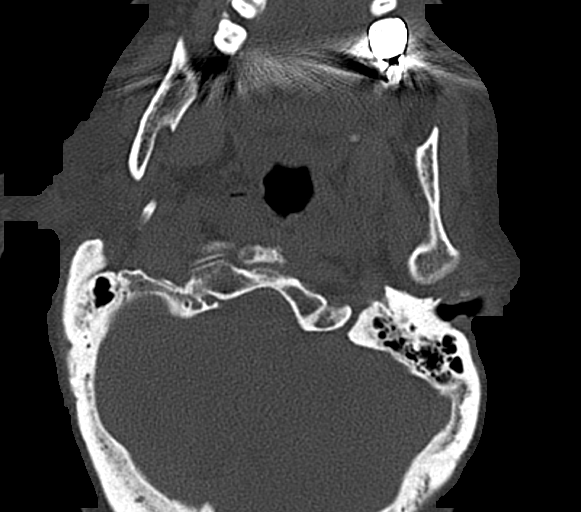

[Series 6: coronal bone · coronal · 0.33mm/px · 3 of 52 slices shown]
[im 11/52  bone]
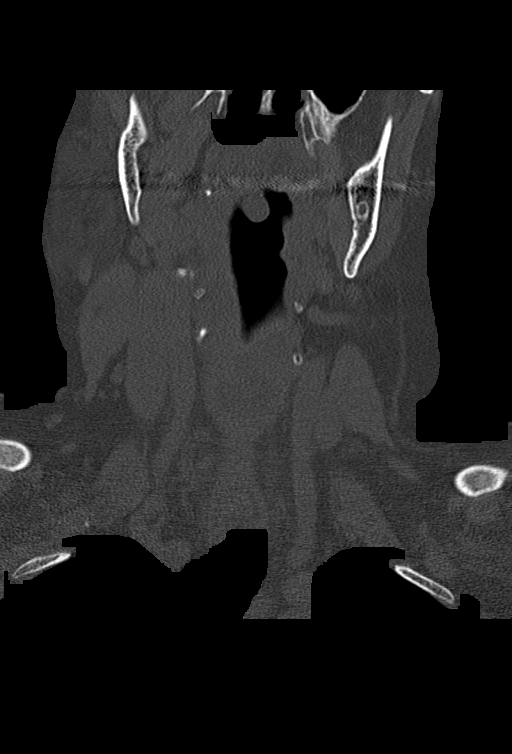
[im 21/52  bone]
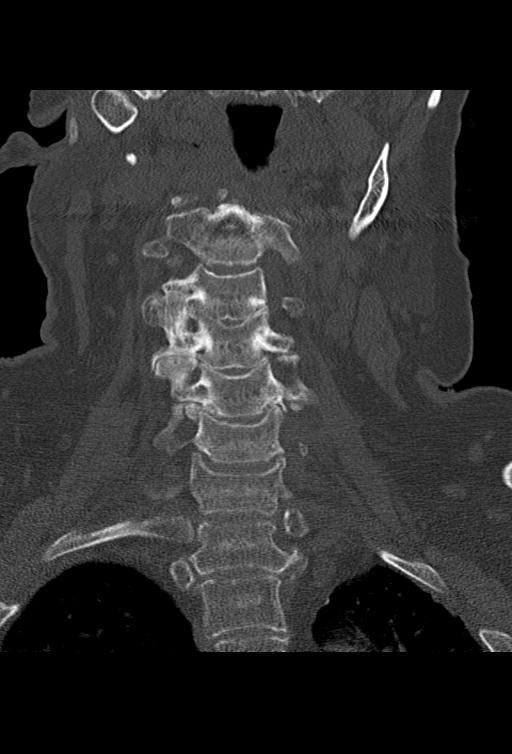
[im 31/52  bone]
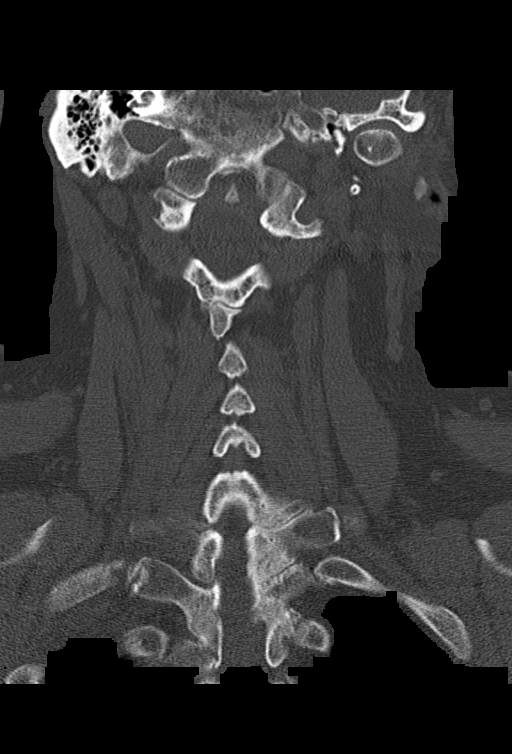

[Series 7: sagittal bone · sagittal · 0.40mm/px · 5 of 52 slices shown, 6 images]
[im 18/52  bone]
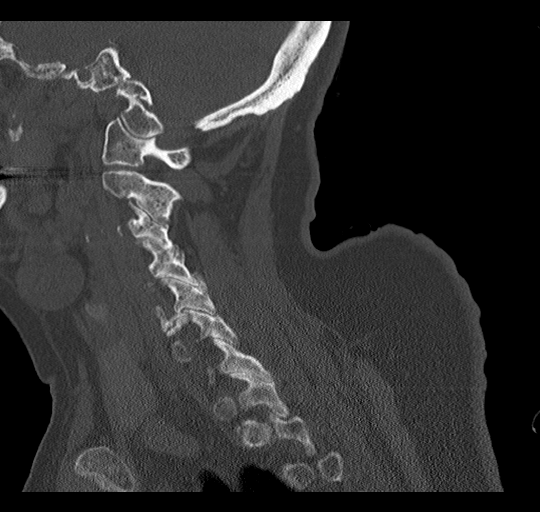
[im 22/52  bone]
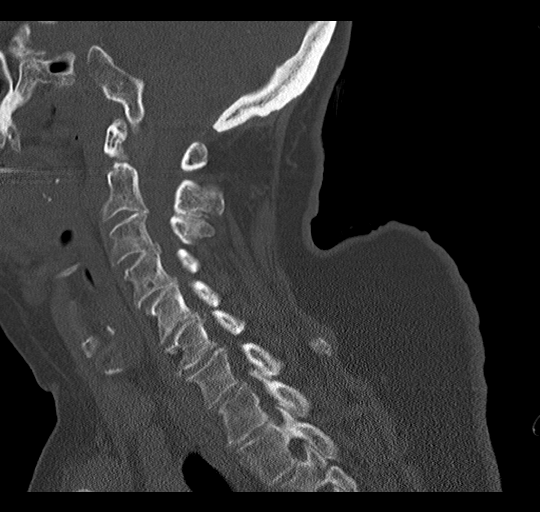
[im 26/52  soft-tissue]
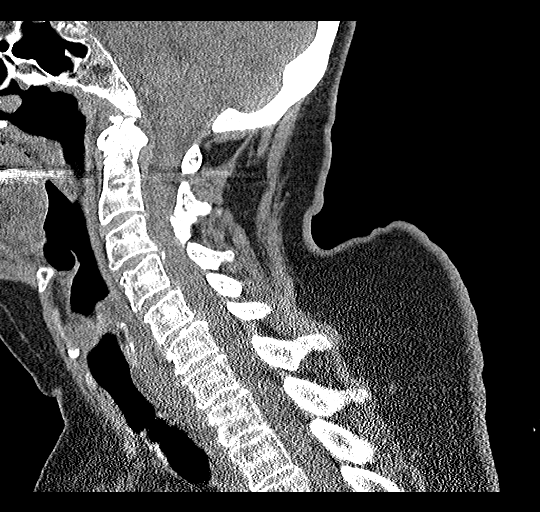
[im 26/52  bone]
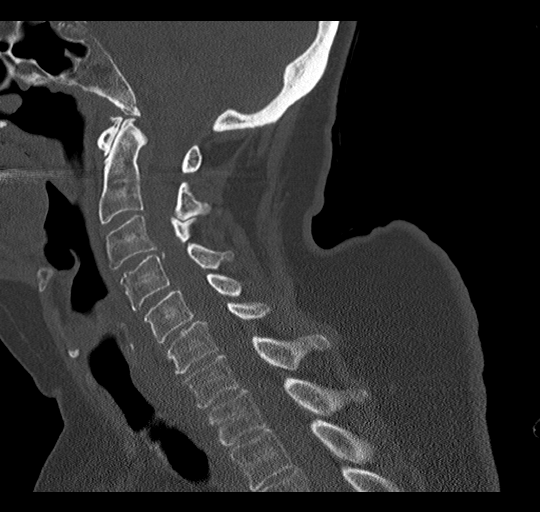
[im 30/52  bone]
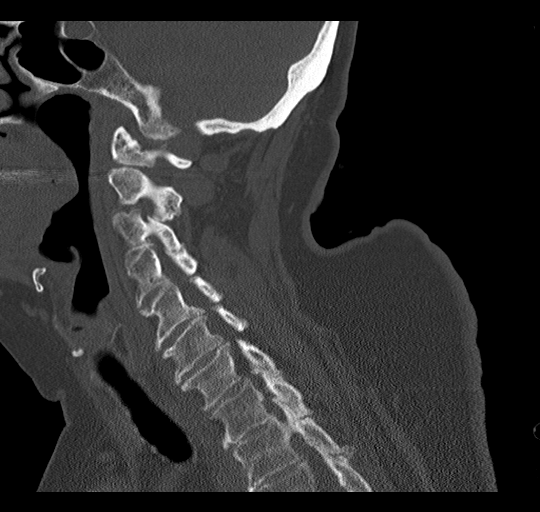
[im 35/52  bone]
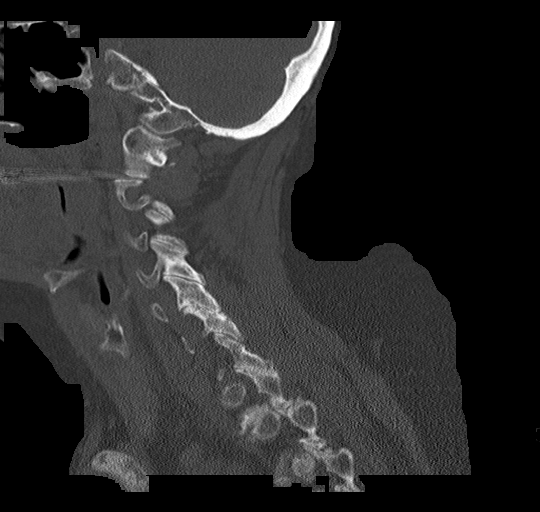

[12 of 33 positions shown; findings below may reference images not displayed]

FINDINGS: CT HEAD FINDINGS

Brain:

No evidence of acute intracranial hemorrhage.

No demarcated cortical infarction.

No evidence of intracranial mass.

No midline shift or extra-axial fluid collection.

Stable, mild generalized parenchymal atrophy.

Vascular: No hyperdense vessel.

Skull: Normal. Negative for fracture or focal lesion.

CT MAXILLOFACIAL FINDINGS

Osseous: No evidence of acute maxillofacial fracture.

Orbits: No acute abnormality.

Sinuses: Moderate/severe mucosal thickening within the right
maxillary sinus. Associated hyperdensity within the right maxillary
sinus, which may reflect inspissated secretions or sequela of
chronic fungal sinusitis. There is a small defect within the floor
of the right maxillary sinus (series 9, image 21). There is also
moderate mucosal thickening within the inferior left maxillary
sinus. There is prominent periapical lucency surrounding the
adjacent posterior left maxillary molar with cortical breakthrough
(series 10, image 46) and the possibility of odontogenic sinusitis
is raised. Additional mild mucosal thickening within the bilateral
frontal and ethmoid air cells.

Soft tissues: Mild soft tissue swelling along the forehead.

CT CERVICAL SPINE FINDINGS

Alignment: Straightening of the expected cervical lordosis. Trace
C4-C5 and C5-C6 anterolisthesis.

Skull base and vertebrae: The basion-dental and atlanto-dental
intervals are maintained.No evidence of acute fracture to the
cervical spine.

Soft tissues and spinal canal: No prevertebral fluid or swelling. No
visible canal hematoma. Calcified plaque within the carotid
arteries.

Disc levels: Cervical spondylosis with multilevel posterior disc
osteophytes, uncovertebral and facet hypertrophy. No high-grade bony
spinal canal stenosis. Multilevel neural foraminal narrowing
greatest on the right at C3-C4, bilaterally at C5-C6 and bilaterally
at C6-C7.

Upper chest: No consolidation within the imaged lung apices. No
visible pneumothorax.
IMPRESSION: CT head:

1. No evidence of acute intracranial abnormality.
2. Stable, mild generalized parenchymal atrophy.

CT maxillofacial:

1. No evidence of acute maxillofacial fracture.
2. Mild forehead soft tissue swelling.
3. Paranasal sinus disease as described. Most notably, there is more
moderate/severe mucosal thickening within the right maxillary sinus.
There is also hyperdensity within the right maxillary sinus which
may reflect inspissated secretions or sequela of chronic fungal
sinusitis. A small defect within the floor of the right maxillary
may reflect an oro-antral fistula. Also of note, adjacent to the
mucosal thickening within the inferior left maxillary sinus there is
prominent periapical lucency surrounding the posterior left
maxillary molar with cortical breakthrough. This raises the
possibility of odontogenic sinusitis.

CT cervical spine:

1. No evidence of acute fracture to the cervical spine.
2. Cervical spondylosis as described.

## 2021-08-27 IMAGING — CR DG HIP (WITH OR WITHOUT PELVIS) 3-4V BILAT
5 series · 5 of 5 positions shown · non-contrast
Comparison: None.

CLINICAL DATA: Fall and pain

EXAM:
DG HIP (WITH OR WITHOUT PELVIS) 3-4V BILAT

[x pelvis]
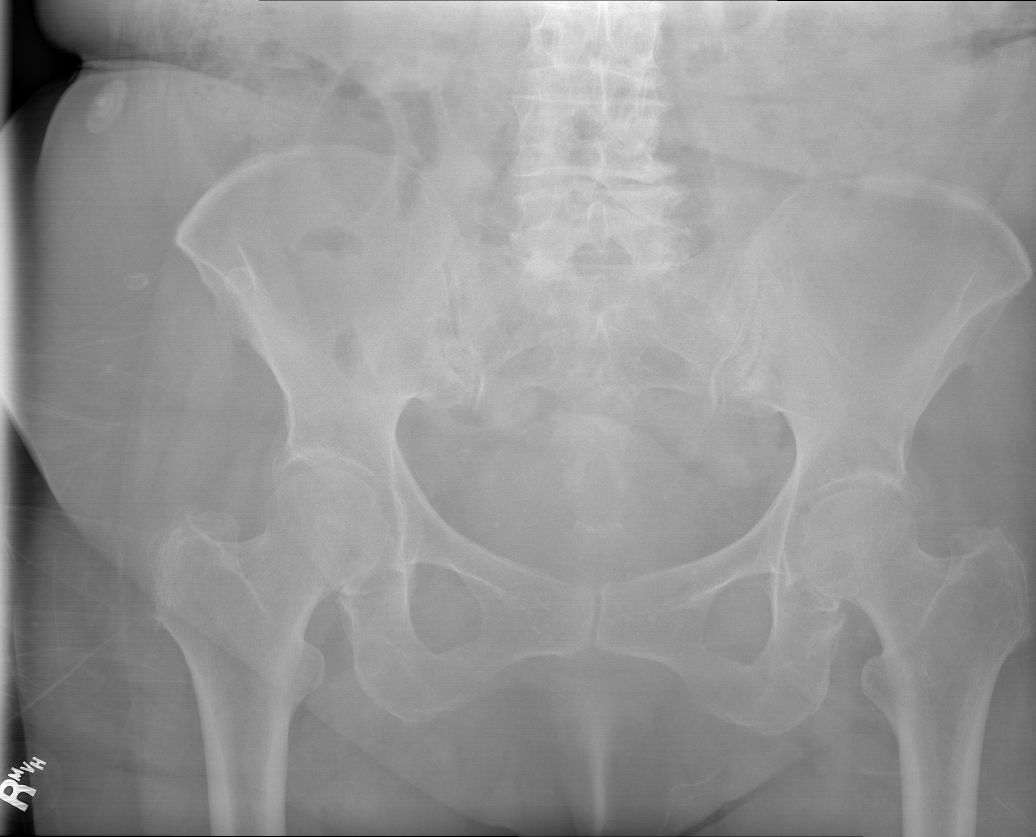

[x hip ap right]
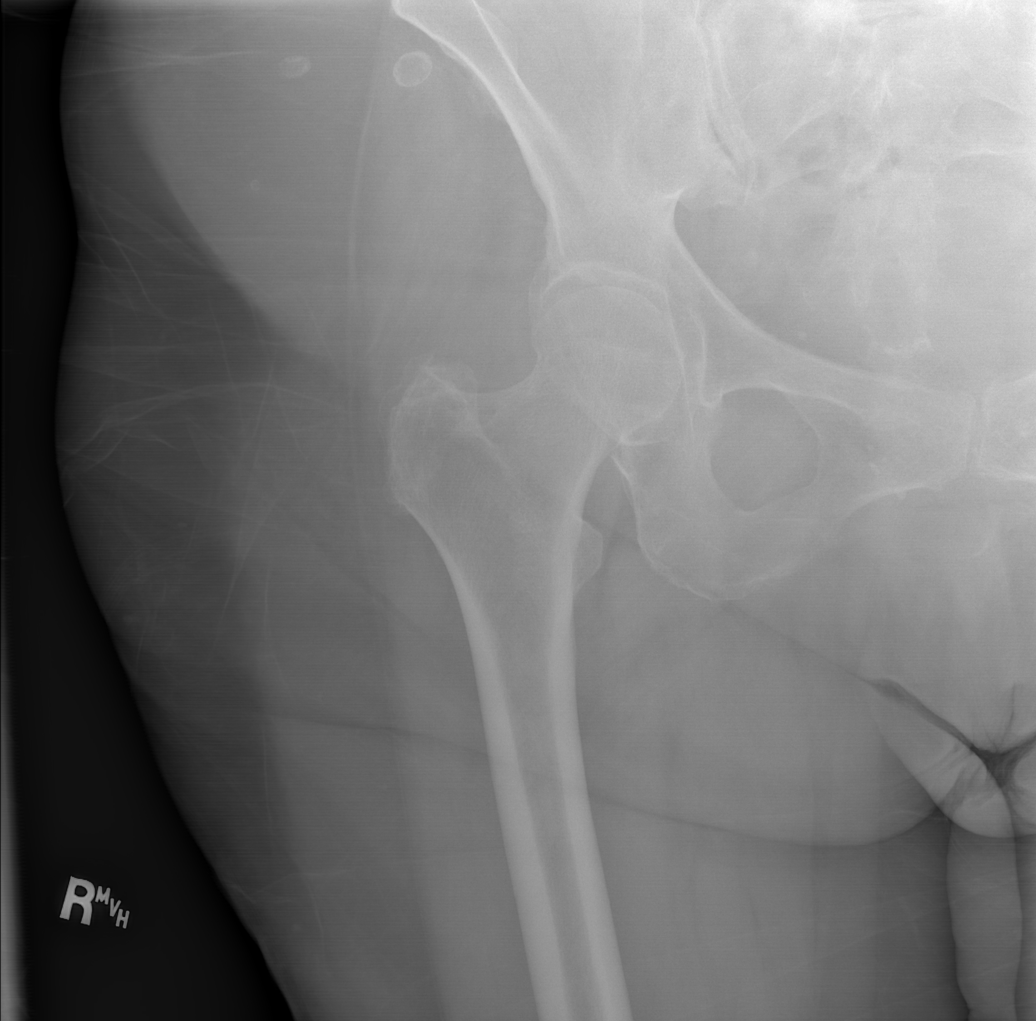

[x hip lat right]
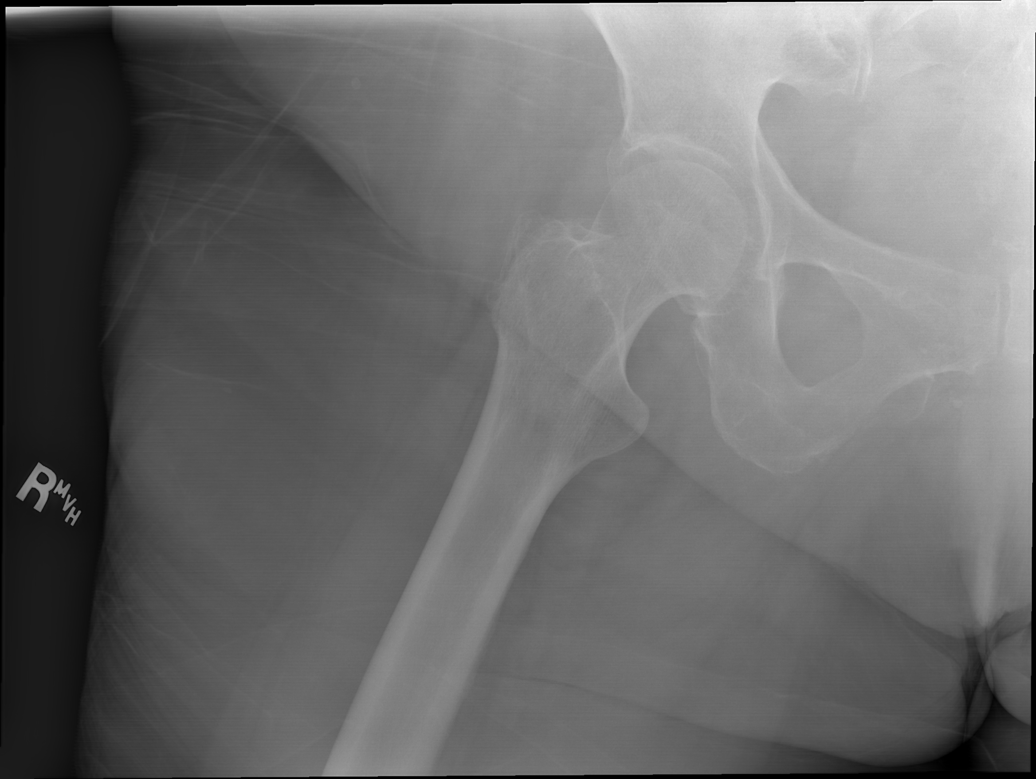

[x hip ap left]
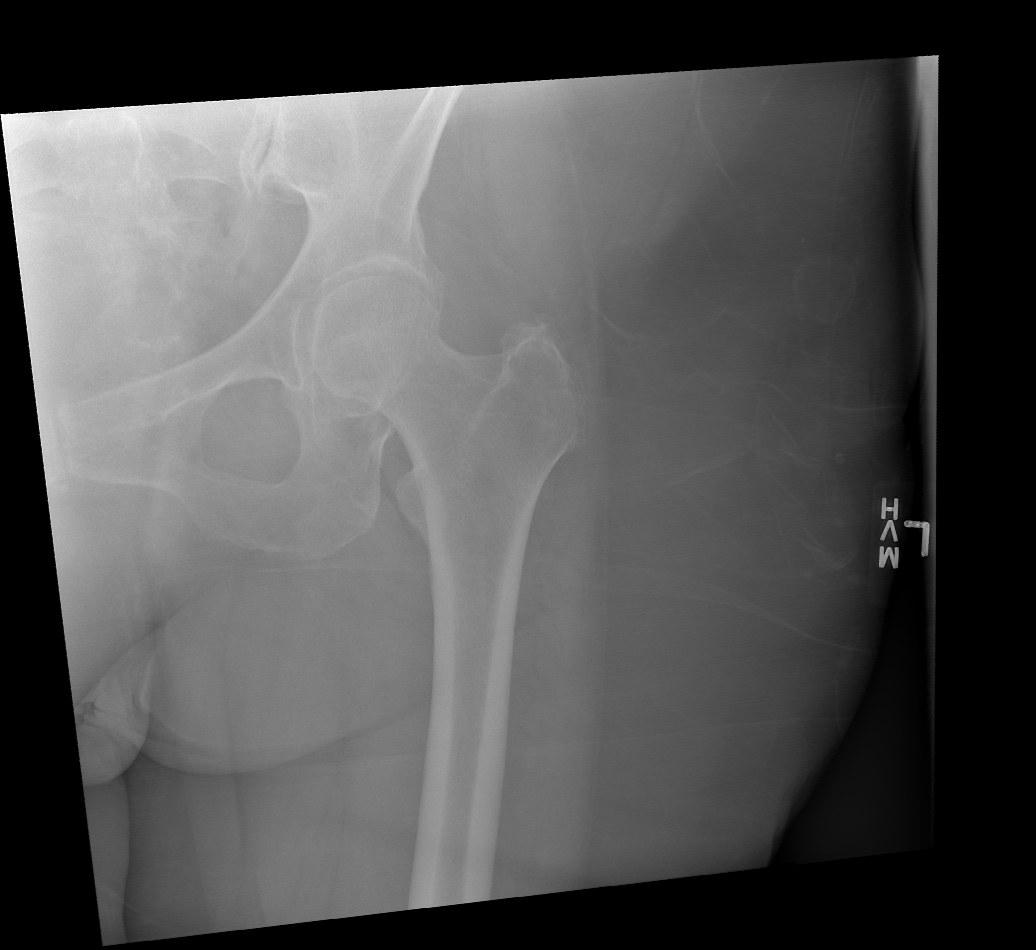

[x hip lat left]
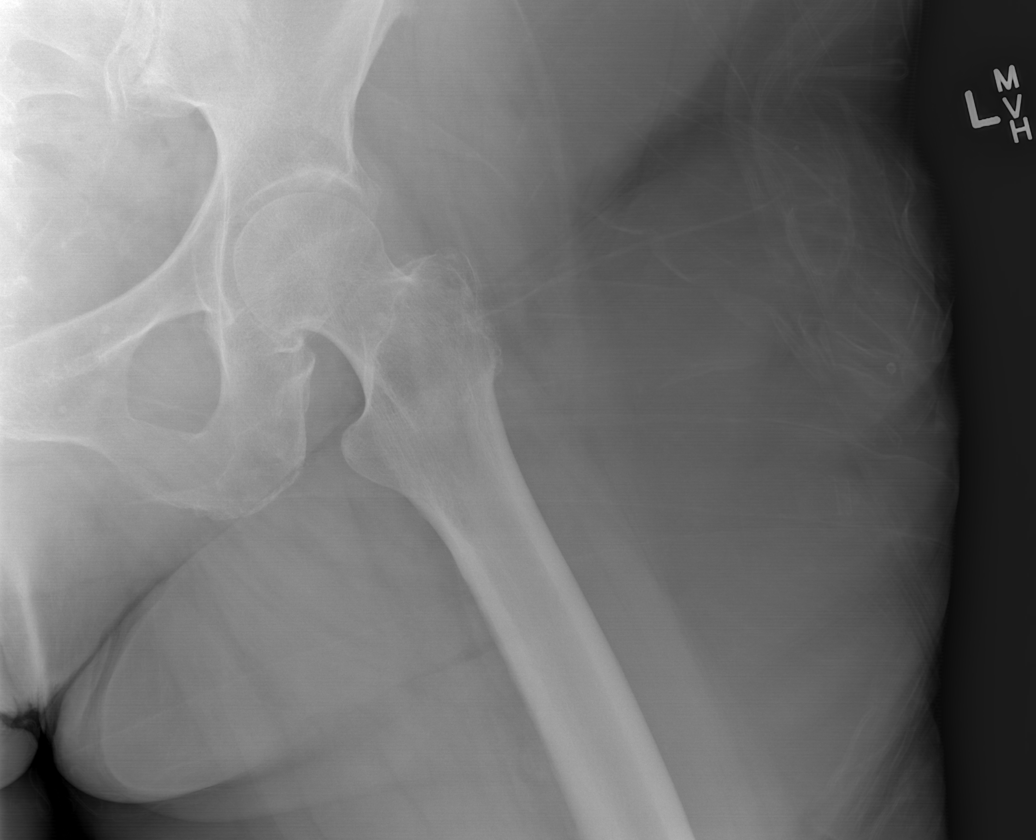

[5 of 5 positions shown; findings below may reference images not displayed]

FINDINGS: There is no evidence of hip fracture or dislocation, however
somewhat limited due to diffuse osteopenia. Degenerative changes in
the lower lumbar spine.
IMPRESSION: No definite acute osseous abnormality. If there is high clinical
suspicion for occult hip fracture or the patient refuses to
weightbear, consider further evaluation with MRI. Although CT is
expeditious, evidence is lacking regarding accuracy of CT over plain
film radiography.

## 2021-10-17 IMAGING — CR DG CHEST 2V
2 series · 2 of 2 positions shown · non-contrast
Comparison: 11/21/2019

CLINICAL DATA: Atrial fibrillation

EXAM:
CHEST - 2 VIEW

[chest lat]
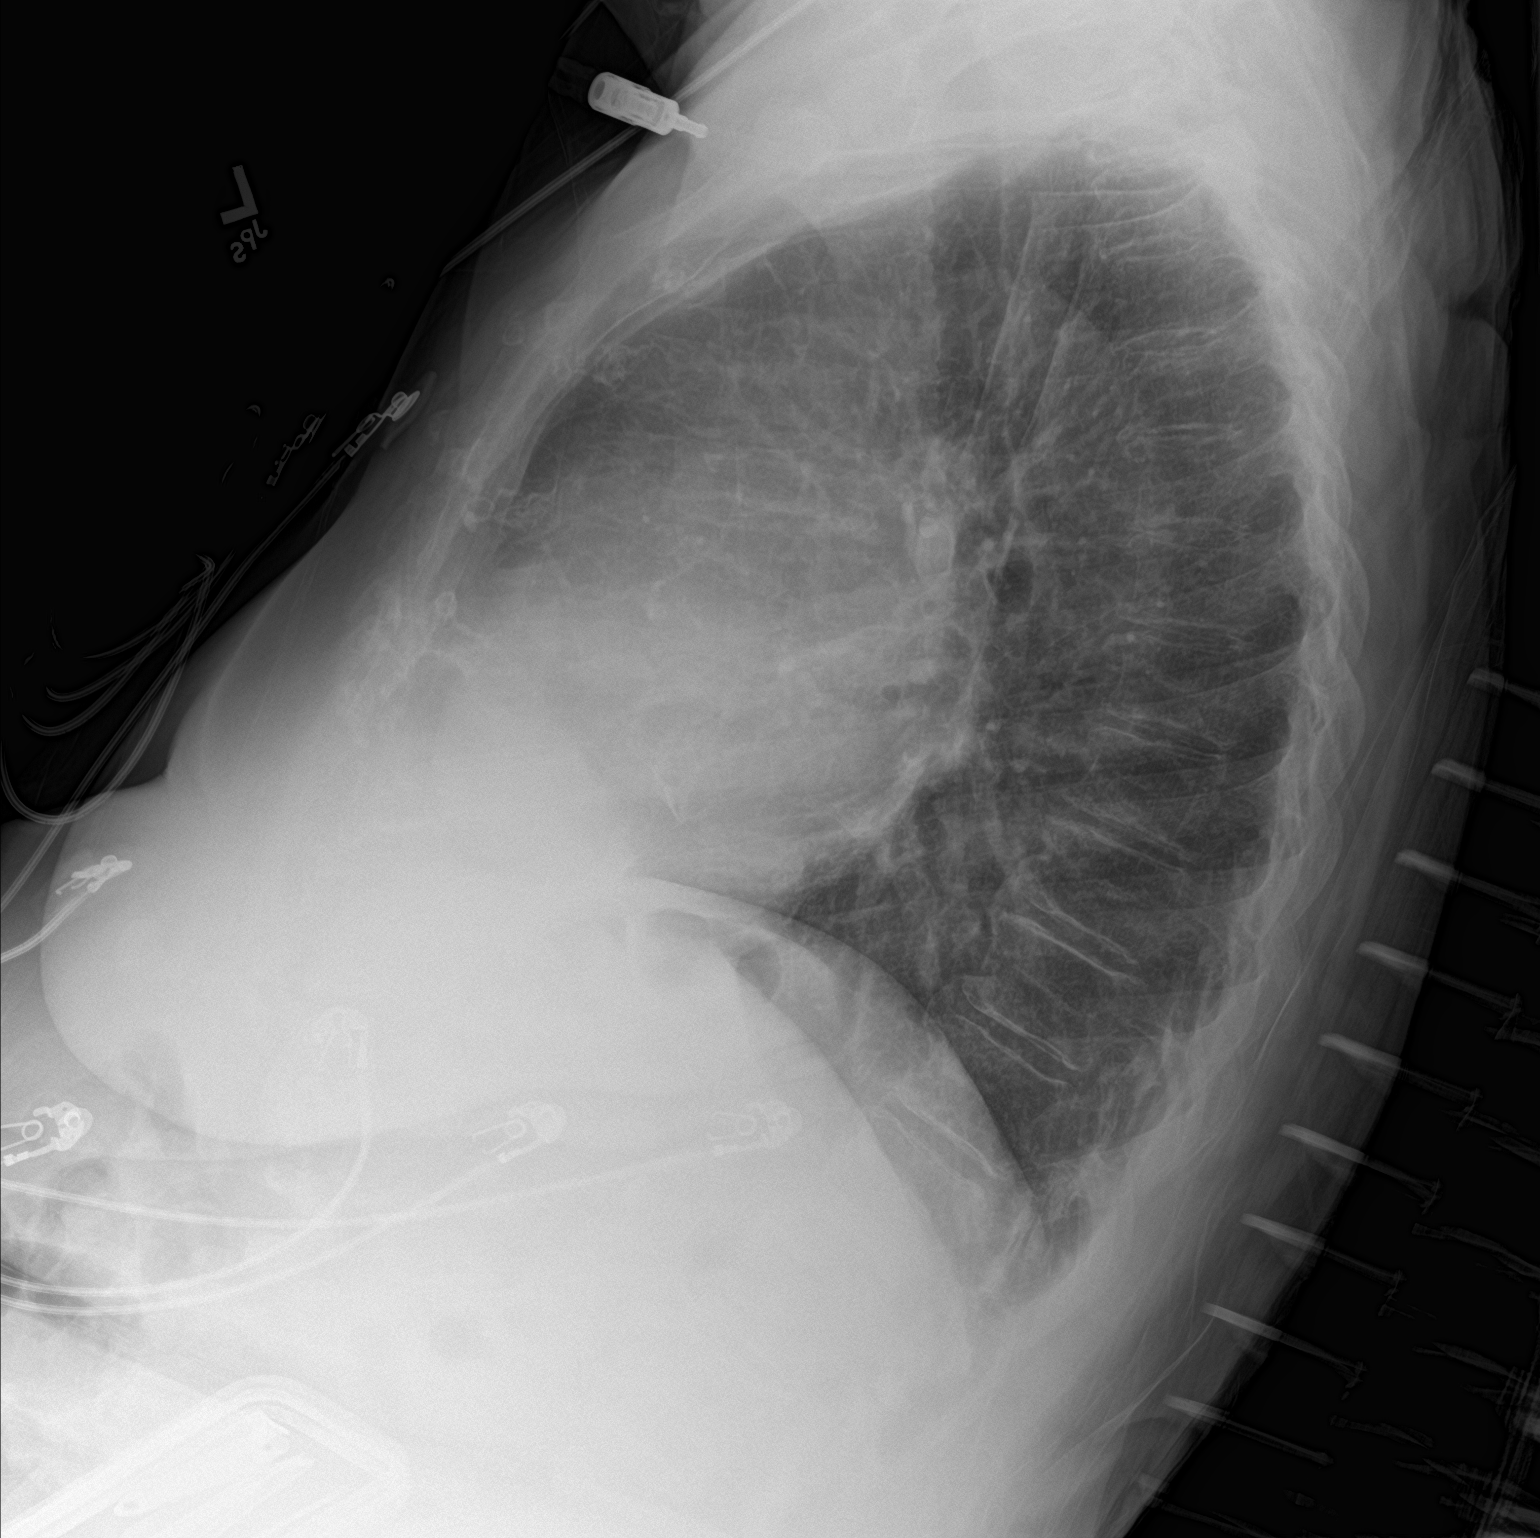

[chest ap]
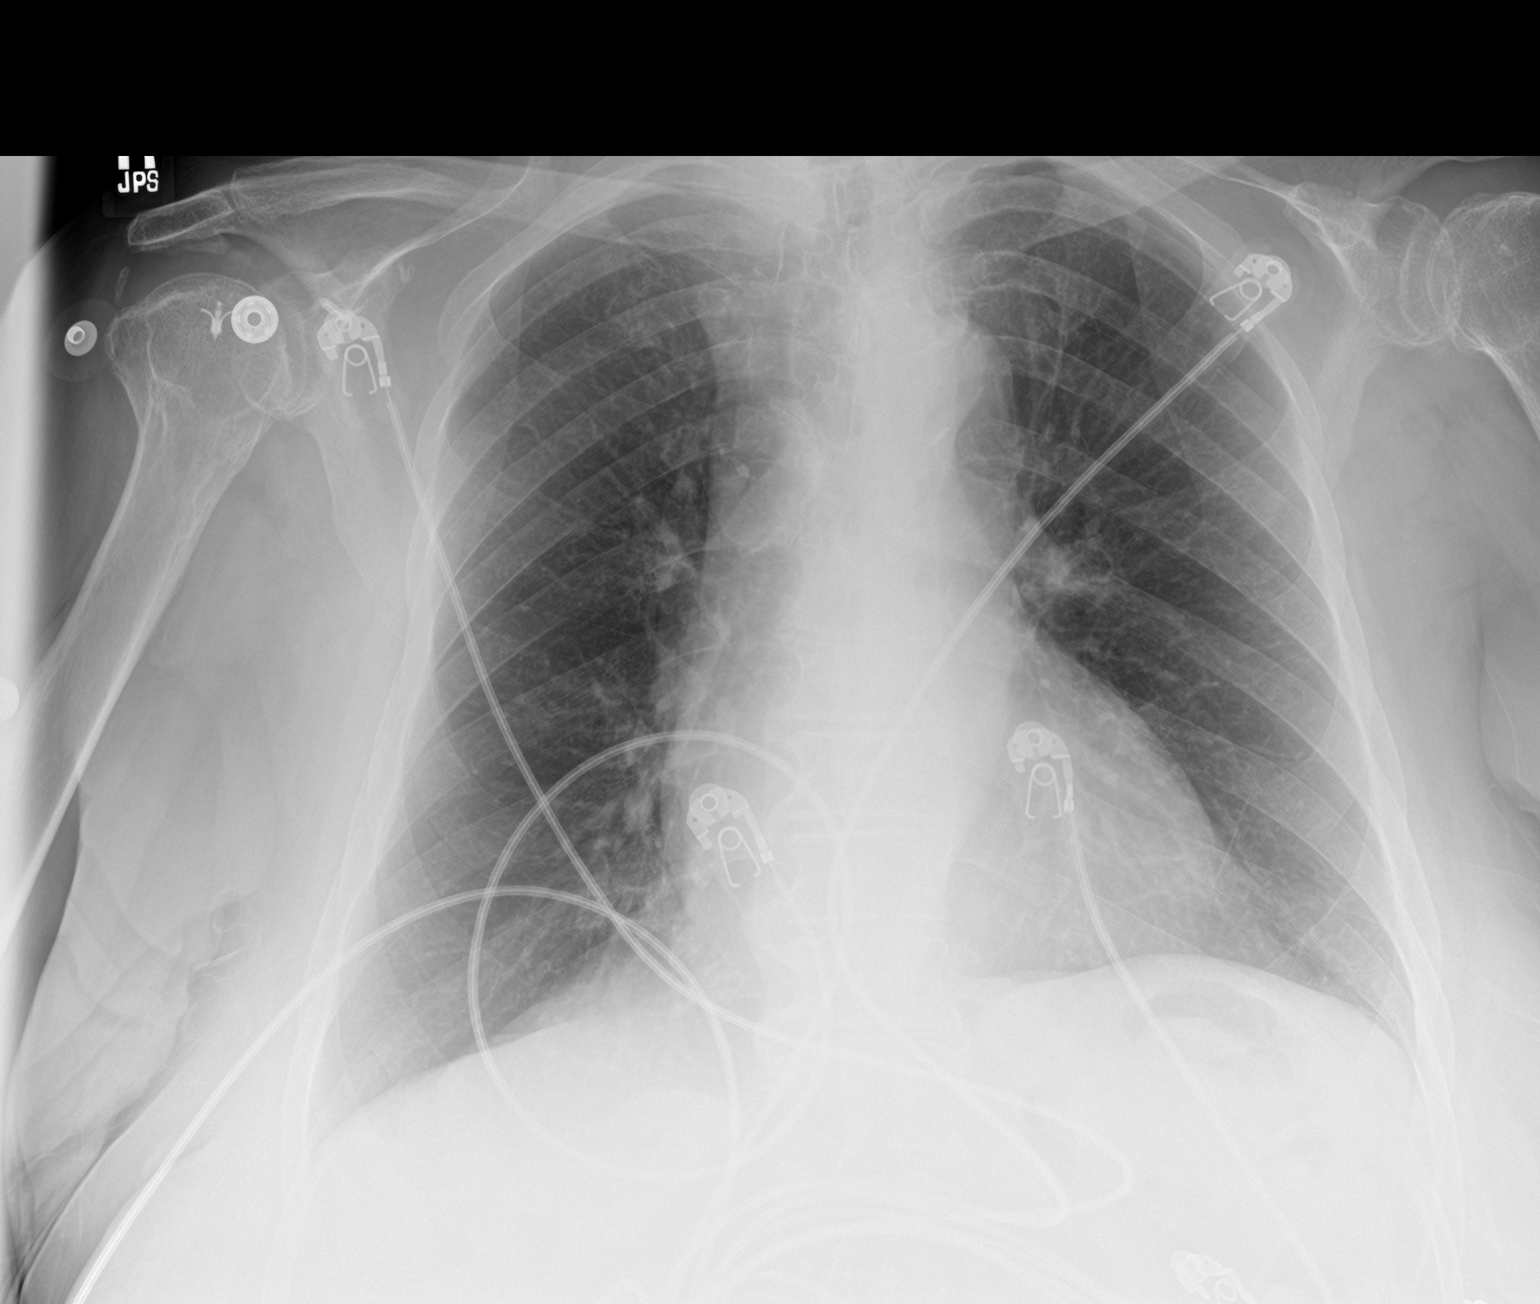

[2 of 2 positions shown; findings below may reference images not displayed]

FINDINGS: Heart and mediastinal contours are within normal limits. No focal
opacities or effusions. No acute bony abnormality.
IMPRESSION: No active cardiopulmonary disease.

## 2021-11-12 ENCOUNTER — Other Ambulatory Visit: Payer: Self-pay | Admitting: Physician Assistant

## 2021-11-12 DIAGNOSIS — M7989 Other specified soft tissue disorders: Secondary | ICD-10-CM

## 2021-11-13 ENCOUNTER — Other Ambulatory Visit: Payer: Self-pay

## 2021-11-13 ENCOUNTER — Ambulatory Visit
Admission: RE | Admit: 2021-11-13 | Discharge: 2021-11-13 | Disposition: A | Discharge status: Home / Self Care | Payer: Medicare Other | Source: Ambulatory Visit | Attending: Physician Assistant | Admitting: Physician Assistant

## 2021-11-13 DIAGNOSIS — M7989 Other specified soft tissue disorders: Secondary | ICD-10-CM

## 2021-11-28 DIAGNOSIS — I82409 Acute embolism and thrombosis of unspecified deep veins of unspecified lower extremity: Secondary | ICD-10-CM | POA: Diagnosis not present

## 2021-12-11 DIAGNOSIS — K529 Noninfective gastroenteritis and colitis, unspecified: Secondary | ICD-10-CM | POA: Diagnosis not present

## 2022-01-07 DIAGNOSIS — R609 Edema, unspecified: Secondary | ICD-10-CM | POA: Diagnosis not present

## 2022-01-07 DIAGNOSIS — Z86718 Personal history of other venous thrombosis and embolism: Secondary | ICD-10-CM | POA: Diagnosis not present

## 2022-02-20 DIAGNOSIS — I82409 Acute embolism and thrombosis of unspecified deep veins of unspecified lower extremity: Secondary | ICD-10-CM | POA: Diagnosis not present

## 2022-02-20 DIAGNOSIS — Z23 Encounter for immunization: Secondary | ICD-10-CM | POA: Diagnosis not present

## 2022-02-20 DIAGNOSIS — E669 Obesity, unspecified: Secondary | ICD-10-CM | POA: Diagnosis not present

## 2022-02-20 DIAGNOSIS — Z0000 Encounter for general adult medical examination without abnormal findings: Secondary | ICD-10-CM | POA: Diagnosis not present

## 2022-04-06 ENCOUNTER — Encounter (HOSPITAL_BASED_OUTPATIENT_CLINIC_OR_DEPARTMENT_OTHER): Payer: Self-pay

## 2022-04-06 ENCOUNTER — Other Ambulatory Visit: Payer: Self-pay

## 2022-04-06 ENCOUNTER — Inpatient Hospital Stay (HOSPITAL_BASED_OUTPATIENT_CLINIC_OR_DEPARTMENT_OTHER)
Admission: EM | Admit: 2022-04-06 | Discharge: 2022-04-10 | DRG: 394 | Disposition: A | Discharge status: Home / Self Care | Payer: Medicare HMO | Attending: Family Medicine | Admitting: Family Medicine

## 2022-04-06 ENCOUNTER — Emergency Department (HOSPITAL_BASED_OUTPATIENT_CLINIC_OR_DEPARTMENT_OTHER): Payer: Medicare HMO

## 2022-04-06 DIAGNOSIS — Z6827 Body mass index (BMI) 27.0-27.9, adult: Secondary | ICD-10-CM

## 2022-04-06 DIAGNOSIS — R109 Unspecified abdominal pain: Secondary | ICD-10-CM | POA: Diagnosis not present

## 2022-04-06 DIAGNOSIS — I495 Sick sinus syndrome: Secondary | ICD-10-CM | POA: Diagnosis present

## 2022-04-06 DIAGNOSIS — R932 Abnormal findings on diagnostic imaging of liver and biliary tract: Secondary | ICD-10-CM | POA: Diagnosis not present

## 2022-04-06 DIAGNOSIS — I480 Paroxysmal atrial fibrillation: Secondary | ICD-10-CM | POA: Diagnosis present

## 2022-04-06 DIAGNOSIS — Z823 Family history of stroke: Secondary | ICD-10-CM | POA: Diagnosis not present

## 2022-04-06 DIAGNOSIS — I340 Nonrheumatic mitral (valve) insufficiency: Secondary | ICD-10-CM | POA: Diagnosis present

## 2022-04-06 DIAGNOSIS — Z880 Allergy status to penicillin: Secondary | ICD-10-CM

## 2022-04-06 DIAGNOSIS — K649 Unspecified hemorrhoids: Secondary | ICD-10-CM | POA: Diagnosis present

## 2022-04-06 DIAGNOSIS — K529 Noninfective gastroenteritis and colitis, unspecified: Secondary | ICD-10-CM | POA: Diagnosis present

## 2022-04-06 DIAGNOSIS — R933 Abnormal findings on diagnostic imaging of other parts of digestive tract: Secondary | ICD-10-CM | POA: Diagnosis not present

## 2022-04-06 DIAGNOSIS — I700 Atherosclerosis of aorta: Secondary | ICD-10-CM | POA: Diagnosis not present

## 2022-04-06 DIAGNOSIS — I4891 Unspecified atrial fibrillation: Secondary | ICD-10-CM

## 2022-04-06 DIAGNOSIS — K6289 Other specified diseases of anus and rectum: Secondary | ICD-10-CM | POA: Diagnosis present

## 2022-04-06 DIAGNOSIS — J90 Pleural effusion, not elsewhere classified: Secondary | ICD-10-CM | POA: Diagnosis present

## 2022-04-06 DIAGNOSIS — K6389 Other specified diseases of intestine: Secondary | ICD-10-CM | POA: Diagnosis not present

## 2022-04-06 DIAGNOSIS — Z86718 Personal history of other venous thrombosis and embolism: Secondary | ICD-10-CM

## 2022-04-06 DIAGNOSIS — Z7901 Long term (current) use of anticoagulants: Secondary | ICD-10-CM

## 2022-04-06 DIAGNOSIS — N289 Disorder of kidney and ureter, unspecified: Secondary | ICD-10-CM | POA: Diagnosis not present

## 2022-04-06 DIAGNOSIS — R634 Abnormal weight loss: Secondary | ICD-10-CM | POA: Diagnosis not present

## 2022-04-06 DIAGNOSIS — E440 Moderate protein-calorie malnutrition: Secondary | ICD-10-CM | POA: Diagnosis present

## 2022-04-06 DIAGNOSIS — K56699 Other intestinal obstruction unspecified as to partial versus complete obstruction: Secondary | ICD-10-CM | POA: Diagnosis present

## 2022-04-06 DIAGNOSIS — K760 Fatty (change of) liver, not elsewhere classified: Secondary | ICD-10-CM | POA: Diagnosis not present

## 2022-04-06 LAB — URINALYSIS, ROUTINE W REFLEX MICROSCOPIC
Bilirubin Urine: NEGATIVE
Glucose, UA: NEGATIVE mg/dL
Ketones, ur: 15 mg/dL — AB
Leukocytes,Ua: NEGATIVE
Nitrite: NEGATIVE
Protein, ur: NEGATIVE mg/dL
Specific Gravity, Urine: 1.01 (ref 1.005–1.030)
pH: 7 (ref 5.0–8.0)

## 2022-04-06 LAB — COMPREHENSIVE METABOLIC PANEL
ALT: 15 U/L (ref 0–44)
AST: 24 U/L (ref 15–41)
Albumin: 3.1 g/dL — ABNORMAL LOW (ref 3.5–5.0)
Alkaline Phosphatase: 58 U/L (ref 38–126)
Anion gap: 9 (ref 5–15)
BUN: 11 mg/dL (ref 8–23)
CO2: 29 mmol/L (ref 22–32)
Calcium: 8.7 mg/dL — ABNORMAL LOW (ref 8.9–10.3)
Chloride: 104 mmol/L (ref 98–111)
Creatinine, Ser: 0.65 mg/dL (ref 0.44–1.00)
GFR, Estimated: >60 mL/min (ref >60)
Glucose, Bld: 101 mg/dL — ABNORMAL HIGH (ref 70–99)
Potassium: 3.8 mmol/L (ref 3.5–5.1)
Sodium: 142 mmol/L (ref 135–145)
Total Bilirubin: 1 mg/dL (ref 0.3–1.2)
Total Protein: 6.9 g/dL (ref 6.5–8.1)

## 2022-04-06 LAB — URINALYSIS, MICROSCOPIC (REFLEX)

## 2022-04-06 LAB — CBC
HCT: 42.6 % (ref 36.0–46.0)
Hemoglobin: 14.1 g/dL (ref 12.0–15.0)
MCH: 31.3 pg (ref 26.0–34.0)
MCHC: 33.1 g/dL (ref 30.0–36.0)
MCV: 94.5 fL (ref 80.0–100.0)
Platelets: 182 10*3/uL (ref 150–400)
RBC: 4.51 MIL/uL (ref 3.87–5.11)
RDW: 14.6 % (ref 11.5–15.5)
WBC: 4.3 10*3/uL (ref 4.0–10.5)
nRBC: 0 % (ref 0.0–0.2)

## 2022-04-06 LAB — LIPASE, BLOOD: Lipase: 34 U/L (ref 11–51)

## 2022-04-06 MED ORDER — DILTIAZEM HCL 25 MG/5ML IV SOLN
10.0000 mg | Freq: Once | INTRAVENOUS | Status: AC
Start: 2022-04-06 — End: 2022-04-06
  Administered 2022-04-06: 10 mg via INTRAVENOUS
  Filled 2022-04-06: qty 5

## 2022-04-06 MED ORDER — METRONIDAZOLE 500 MG/100ML IV SOLN
500.0000 mg | Freq: Once | INTRAVENOUS | Status: AC
  Administered 2022-04-06: 500 mg via INTRAVENOUS
  Filled 2022-04-06: qty 100

## 2022-04-06 MED ORDER — IOHEXOL 9 MG/ML PO SOLN: 500.0000 mL | ORAL | Status: DC

## 2022-04-06 MED ORDER — SODIUM CHLORIDE 0.9 % IV SOLN
2.0000 g | Freq: Once | INTRAVENOUS | Status: AC
  Administered 2022-04-06: 2 g via INTRAVENOUS
  Filled 2022-04-06: qty 12.5

## 2022-04-06 MED ORDER — IOHEXOL 300 MG/ML  SOLN
100.0000 mL | Freq: Once | INTRAMUSCULAR | Status: AC | PRN
  Administered 2022-04-06: 100 mL via INTRAVENOUS

## 2022-04-06 NOTE — ED Provider Notes (Signed)
[facility] EMERGENCY DEPARTMENT Provider Note   CSN: [id number] Arrival date & time: 04/06/22  1405     History  Chief Complaint  Patient presents with   Abdominal Pain    Angela Leach is a 88 y.o. female.   Abdominal Pain   Patient has history of atrial fibrillation, prior DVT.  Patient presents to the ED with complaints of abdominal pain ongoing for the last couple of months.  Patient states she initially saw her primary care doctor.  Patient states she felt like he did not know what was going on so she saw another doctor.  Patient denies having any specific testing to evaluate the cause of her abdominal pain.  Patient states that in her lower abdomen and has been getting worse.  Initially when it first started she had episodes of vomiting and diarrhea but has not had any for the last several weeks.  She denies any urinary symptoms.  She does feel like she has been burping and belching a lot.  Home Medications Prior to Admission medications   Medication Sig Start Date End Date Taking? Authorizing Provider  CALCIUM-VITAMIN D PO Take 1 tablet by mouth every evening.    [provider]  Misc Natural Products (WHITE WILLOW BARK PO) Take 1 tablet by mouth daily.    [provider]  Multiple Vitamins-Minerals (MULTIVITAMIN ADULTS) TABS Take 1 tablet by mouth daily.    [provider]      Allergies    Penicillins    Review of Systems   Review of Systems  Gastrointestinal:  Positive for abdominal pain.    Physical Exam Updated Vital Signs BP (!) 127/109   Pulse 90   Temp 98.2 F (36.8 C) (Oral)   Resp 18   Ht 1.676 m (5\' 6" )   Wt 77.1 kg   SpO2 96%   BMI 27.44 kg/m  Physical Exam Vitals and nursing note reviewed.  Constitutional:      Appearance: She is well-developed. She is not diaphoretic.  HENT:     Head: Normocephalic and atraumatic.     Right Ear: External ear normal.     Left Ear: External ear normal.  Eyes:      General: No scleral icterus.       Right eye: No discharge.        Left eye: No discharge.     Conjunctiva/sclera: Conjunctivae normal.  Neck:     Trachea: No tracheal deviation.  Cardiovascular:     Rate and Rhythm: Normal rate and regular rhythm.  Pulmonary:     Effort: Pulmonary effort is normal. No respiratory distress.     Breath sounds: Normal breath sounds. No stridor. No wheezing or rales.  Abdominal:     General: Bowel sounds are normal. There is no distension.     Palpations: Abdomen is soft.     Tenderness: There is abdominal tenderness in the right lower quadrant, suprapubic area and left lower quadrant. There is no guarding or rebound.     Hernia: No hernia is present.  Musculoskeletal:        General: No tenderness or deformity.     Cervical back: Neck supple.  Skin:    General: Skin is warm and dry.     Findings: No rash.  Neurological:     General: No focal deficit present.     Mental Status: She is alert.     Cranial Nerves: No cranial nerve deficit (no facial droop, extraocular  movements intact, no slurred speech).     Sensory: No sensory deficit.     Motor: No abnormal muscle tone or seizure activity.     Coordination: Coordination normal.  Psychiatric:        Mood and Affect: Mood normal.     ED Results / Procedures / Treatments   Labs (all labs ordered are listed, but only abnormal results are displayed) Labs Reviewed  COMPREHENSIVE METABOLIC PANEL - Abnormal; Notable for the following components:      Result Value   Glucose, Bld 101 (*)    Calcium 8.7 (*)    Albumin 3.1 (*)    All other components within normal limits  CBC  LIPASE, BLOOD  URINALYSIS, ROUTINE W REFLEX MICROSCOPIC    EKG EKG Interpretation  Date/Time:  Monday April 06 2022 17:01:34 EDT Ventricular Rate:  132 PR Interval:    QRS Duration: 87 QT Interval:  323 QTC Calculation: 479 R Axis:   59 Text Interpretation: Atrial fibrillation Nonspecific T abnrm, anterolateral leads  a fib new since last tracing Confirmed by Linwood Dibbles [id number]) on 04/06/2022 5:07:41 PM  Radiology DG Chest 2 View  Result Date: 04/06/2022 CLINICAL DATA:  Atrial fibrillation. EXAM: CHEST - 2 VIEW COMPARISON:  None Available. FINDINGS: Heart is mildly enlarged. Both lungs are clear. The visualized skeletal structures are unremarkable. IMPRESSION: No active cardiopulmonary disease. Mild cardiomegaly. Electronically Signed   By: Darliss Cheney M.D.   On: 04/06/2022 18:39   CT ABDOMEN PELVIS W CONTRAST  Result Date: 04/06/2022 CLINICAL DATA:  88 year old with abdominal pain for 2 months. Decreased p.o. intake. EXAM: CT ABDOMEN AND PELVIS WITH CONTRAST TECHNIQUE: Multidetector CT imaging of the abdomen and pelvis was performed using the standard protocol following bolus administration of intravenous contrast. RADIATION DOSE REDUCTION: This exam was performed according to the departmental dose-optimization program which includes automated exposure control, adjustment of the mA and/or kV according to patient size and/or use of iterative reconstruction technique. CONTRAST:  OMNIPAQUE IOHEXOL 300 MG/ML  SOLN COMPARISON:  None Available. FINDINGS: Lower chest: Moderate small left pleural effusion with adjacent compressive atelectasis. Heart is normal in size. Hepatobiliary: Decreased hepatic density typical of steatosis. Subcentimeter enhancing focus in the central right lobe series 2, image 24, too small to characterize. Cholecystectomy. 10 mm common bile duct is normal post cholecystectomy. No visualized choledocholithiasis. Pancreas: Diffuse fatty atrophy. No ductal dilatation or inflammation. Spleen: Normal in size without focal abnormality. Adrenals/Urinary Tract: Normal adrenal glands. No hydronephrosis. No renal calculi or focal lesion. Partially distended urinary bladder, possible mild wall thickening at the dome. Stomach/Bowel: There is circumferential rectal wall thickening, series 2, image 71. There  also areas of wall thickening involving the mid proximal sigmoid colon, series 2, image 66 with adjacent pericolonic edema. Small to moderate volume of stool in the remainder of the colon, there is colonic tortuosity. The appendix is not definitively visualized. No small bowel obstruction or inflammatory change. Small hiatal hernia, otherwise unremarkable appearance of the stomach. Vascular/Lymphatic: Aortic atherosclerosis. No aortic aneurysm. No portal venous or mesenteric gas. No bulky abdominopelvic adenopathy. Reproductive: Mild quiescent uterus with slight periuterine stranding, like reactive due to adjacent colonic fat stranding. No adnexal mass. Other: Trace free fluid in the pelvis. Moderate presacral edema. 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 No abdominal wall hernia. Musculoskeletal: The bones are under mineralized. Faint sclerotic focus in the mid sacrum  is likely a bone island. Mild scoliosis and diffuse degenerative change in the spine. IMPRESSION: 1. Circumferential rectal wall thickening as well as area of wall thickening and edema involving the mid proximal sigmoid colon, suspicious for proctitis and colitis. Consider direct visualization with colonoscopy after course of treatment to exclude underlying colonic neoplasm. 2. Moderate small left pleural effusion with adjacent compressive atelectasis. 3. Hepatic steatosis. Subcentimeter enhancing focus in the central right lobe is too small to characterize. Given size, this cannot be further characterize, no dedicated follow-up recommended. 4. Small hiatal hernia. Aortic Atherosclerosis (ICD10-I70.0). Electronically Signed   By: Narda Rutherford M.D.   On: 04/06/2022 18:18    Procedures .Critical Care  Performed by: Linwood Dibbles, MD Authorized by: Linwood Dibbles, MD   Critical care provider statement:    Critical care time (minutes):  30    Critical care was time spent personally by me on the following activities:  Development of treatment plan with patient or surrogate, discussions with consultants, evaluation of patient's response to treatment, examination of patient, ordering and review of laboratory studies, ordering and review of radiographic studies, ordering and performing treatments and interventions, pulse oximetry, re-evaluation of patient's condition and review of old charts     Medications Ordered in ED Medications  ceFEPIme (MAXIPIME) 2 g in sodium chloride 0.9 % 100 mL IVPB (0 g Intravenous Stopped 04/06/22 2025)    And  metroNIDAZOLE (FLAGYL) IVPB 500 mg (500 mg Intravenous New Bag/Given 04/06/22 2029)  diltiazem (CARDIZEM) injection 10 mg (10 mg Intravenous Given 04/06/22 1757)  iohexol (OMNIPAQUE) 300 MG/ML solution 100 mL (100 mLs Intravenous Contrast Given 04/06/22 1738)    ED Course/ Medical Decision Making/ A&P Clinical Course as of 04/06/22 2110  Mon Apr 06, 2022  1619 Comprehensive metabolic panel(!) No significant abnormalities noted on metabolic panel [JK]  1619 CBC Normal [JK]  1839 CT ABDOMEN PELVIS W CONTRAST CT scan shows area of inflammation in the colon suggestive of proctitis/colitis [JK]  1839 DG Chest 2 View Chest x-ray shows evidence of a pleural effusion [JK]  2110 Case discussed with Dr Loney Loh regarding admission [JK]    Clinical Course User Index [JK] Linwood Dibbles, MD                           Medical Decision Making Differential diagnosis includes but not limited to diverticulitis colitis, malignancy  Problems Addressed: Atrial fibrillation, unspecified type [facility]): chronic illness or injury with exacerbation, progression, or side effects of treatment Colitis: acute illness or injury that poses a threat to life or bodily functions Pleural effusion: acute illness or injury  Amount and/or Complexity of Data Reviewed Labs: ordered. Decision-making details documented in ED  Course. Radiology: ordered. Decision-making details documented in ED Course.  Risk Prescription drug management. Decision regarding hospitalization.   Patient presented to the ED for evaluation of abdominal pain.  Patient ALSO describes weight loss.  She denies any fevers.  No significant abnormalities noted on the labs.  Concerned about her complaints of pain and weight loss.  CT scan was performed.  Shows findings suggestive of colitis.  However colonoscopy recommended after course of antibiotics.  I am still concerned about the possibility of malignancy.  We will start the patient on IV antibiotics.  I think with her age risk factors it is reasonable to bring her in the hospital for close observation.  Patient also noted to be in rapid A-fib initially.  She does have history  of A-fib.  She was given a dose of Cardizem.  Heart rate improved.  Pleural effusion also noted on xray.  No complaints of shortness of breath.        Final Clinical Impression(s) / ED Diagnoses Final diagnoses:  Colitis  Atrial fibrillation, unspecified type (HCC)  Pleural effusion    Rx / DC Orders ED Discharge Orders     None         Linwood Dibbles, MD 04/06/22 2111

## 2022-04-06 NOTE — ED Triage Notes (Signed)
C/o lower abdominal pain x 2 months, decreased oral intake. Belching often. States saw PCP who wasn't sure what was causing pain.

## 2022-04-06 NOTE — Plan of Care (Signed)
[facility] will assume care on arrival to accepting facility. Until arrival, care as per EDP. However, [facility] available 24/7 for questions and assistance.  Nursing staff, please page [facility] Admits and Consults ([phone]) as soon as the patient arrives to the hospital.   

## 2022-04-07 DIAGNOSIS — K529 Noninfective gastroenteritis and colitis, unspecified: Secondary | ICD-10-CM | POA: Diagnosis present

## 2022-04-07 DIAGNOSIS — Z86718 Personal history of other venous thrombosis and embolism: Secondary | ICD-10-CM | POA: Diagnosis not present

## 2022-04-07 DIAGNOSIS — Z6827 Body mass index (BMI) 27.0-27.9, adult: Secondary | ICD-10-CM | POA: Diagnosis not present

## 2022-04-07 DIAGNOSIS — I4891 Unspecified atrial fibrillation: Secondary | ICD-10-CM | POA: Diagnosis not present

## 2022-04-07 DIAGNOSIS — I480 Paroxysmal atrial fibrillation: Secondary | ICD-10-CM | POA: Diagnosis present

## 2022-04-07 DIAGNOSIS — I340 Nonrheumatic mitral (valve) insufficiency: Secondary | ICD-10-CM | POA: Diagnosis present

## 2022-04-07 DIAGNOSIS — E440 Moderate protein-calorie malnutrition: Secondary | ICD-10-CM | POA: Insufficient documentation

## 2022-04-07 DIAGNOSIS — J90 Pleural effusion, not elsewhere classified: Secondary | ICD-10-CM | POA: Diagnosis present

## 2022-04-07 DIAGNOSIS — I495 Sick sinus syndrome: Secondary | ICD-10-CM | POA: Diagnosis present

## 2022-04-07 DIAGNOSIS — Z823 Family history of stroke: Secondary | ICD-10-CM | POA: Diagnosis not present

## 2022-04-07 DIAGNOSIS — K6289 Other specified diseases of anus and rectum: Secondary | ICD-10-CM | POA: Diagnosis not present

## 2022-04-07 DIAGNOSIS — R634 Abnormal weight loss: Secondary | ICD-10-CM

## 2022-04-07 DIAGNOSIS — K56699 Other intestinal obstruction unspecified as to partial versus complete obstruction: Secondary | ICD-10-CM | POA: Diagnosis not present

## 2022-04-07 DIAGNOSIS — K649 Unspecified hemorrhoids: Secondary | ICD-10-CM | POA: Diagnosis present

## 2022-04-07 DIAGNOSIS — Z7901 Long term (current) use of anticoagulants: Secondary | ICD-10-CM | POA: Diagnosis not present

## 2022-04-07 DIAGNOSIS — N289 Disorder of kidney and ureter, unspecified: Secondary | ICD-10-CM | POA: Diagnosis not present

## 2022-04-07 DIAGNOSIS — Z880 Allergy status to penicillin: Secondary | ICD-10-CM | POA: Diagnosis not present

## 2022-04-07 MED ORDER — METRONIDAZOLE 500 MG/100ML IV SOLN
500.0000 mg | Freq: Two times a day (BID) | INTRAVENOUS | Status: DC
  Administered 2022-04-07 (×2): 500 mg via INTRAVENOUS
  Filled 2022-04-07 (×2): qty 100

## 2022-04-07 MED ORDER — ACETAMINOPHEN 325 MG PO TABS: 650.0000 mg | ORAL_TABLET | Freq: Four times a day (QID) | ORAL | Status: DC | PRN

## 2022-04-07 MED ORDER — ADULT MULTIVITAMIN W/MINERALS CH
1.0000 | ORAL_TABLET | Freq: Every day | ORAL | Status: DC
  Administered 2022-04-07 – 2022-04-10 (×3): 1 via ORAL
  Filled 2022-04-07 (×3): qty 1

## 2022-04-07 MED ORDER — LACTATED RINGERS IV SOLN: INTRAVENOUS | Status: DC

## 2022-04-07 MED ORDER — RIVAROXABAN 20 MG PO TABS
20.0000 mg | ORAL_TABLET | Freq: Every day | ORAL | Status: DC
  Administered 2022-04-07: 20 mg via ORAL
  Filled 2022-04-07: qty 1

## 2022-04-07 MED ORDER — ONDANSETRON HCL 4 MG/2ML IJ SOLN: 4.0000 mg | Freq: Three times a day (TID) | INTRAMUSCULAR | Status: DC | PRN

## 2022-04-07 MED ORDER — OXYCODONE HCL 5 MG PO TABS: 5.0000 mg | ORAL_TABLET | Freq: Once | ORAL | Status: DC | PRN

## 2022-04-07 MED ORDER — SODIUM CHLORIDE 0.9 % IV SOLN
2.0000 g | Freq: Two times a day (BID) | INTRAVENOUS | Status: DC
  Administered 2022-04-07 (×2): 2 g via INTRAVENOUS
  Filled 2022-04-07 (×3): qty 12.5

## 2022-04-07 NOTE — Progress Notes (Signed)
   04/07/22 0000  Assess: MEWS Score  Temp 98 F (36.7 C)  BP (!) 118/104  MAP (mmHg) 111  ECG Heart Rate (!) 128  Resp 20  SpO2 95 %  Assess: MEWS Score  MEWS Temp 0  MEWS Systolic 0  MEWS Pulse 2  MEWS RR 0  MEWS LOC 0  MEWS Score 2  MEWS Score Color Yellow  Assess: if the MEWS score is Yellow or Red  Were vital signs taken at a resting state? Yes  Focused Assessment No change from prior assessment  Does the patient meet 2 or more of the SIRS criteria? No  MEWS guidelines implemented *See Row Information* No, previously yellow, continue vital signs every 4 hours  Notify: Charge Nurse/RN  Name of Charge Nurse/RN Notified Phylliss Blakes., RN  Date Charge Nurse/RN Notified 04/07/22  Time Charge Nurse/RN Notified 0130  Assess: SIRS CRITERIA  SIRS Temperature  0  SIRS Pulse 1  SIRS Respirations  0  SIRS WBC 0  SIRS Score Sum  1

## 2022-04-07 NOTE — Progress Notes (Signed)
  Transition of Care [facility]) Screening Note   Patient Details  Name: Angela Leach Date of Birth: 12/04/1932   Transition of Care [facility]) CM/SW Contact:    Lanier Clam, RN Phone Number: 04/07/2022, 1:21 PM    Transition of Care Department [facility]) has reviewed patient and no TOC needs have been identified at this time. We will continue to monitor patient advancement through interdisciplinary progression rounds. If new patient transition needs arise, please place a TOC consult.

## 2022-04-07 NOTE — H&P (Signed)
History and Physical    Patient: Angela Leach [id number]:[id number] DOB: 1933-02-19 DOA: 04/06/2022 DOS: the patient was seen and examined on 04/07/2022 PCP: Clovis Riley, L.August Saucer, MD  Patient coming from: Home  Chief Complaint:  Chief Complaint  Patient presents with   Abdominal Pain   HPI: Angela Leach is a 88 y.o. female with medical history significant of tachy-brady syndrome, a fib on xarelto, DVT. Presenting with abdominal pain. Symptoms started 1 months ago. She's had intermittent sharp pains in the lower quadrants. No fever, N/V/D, or sick contacts. She has been come more fatigued in the last several days and had poorer appetite. She does note an unintentional weight loss of 53lbs since Christmas. She has not tried any therapies at home. When her symptoms did not resolve yesterday, she decided to come to the ED for assistance. She denies any other aggravating or alleviating factors.     Review of Systems: As mentioned in the history of present illness. All other systems reviewed and are negative. Past Medical History:  Diagnosis Date   PVC (premature ventricular contraction) 01/11/2020   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   History reviewed. No pertinent surgical history. Social History:  reports that she has never smoked. She has never used smokeless tobacco. No history on file for alcohol use and drug use.  Allergies  Allergen Reactions   Penicillins     Did it involve swelling of the face/tongue/throat, SOB, or low BP? Y Did it involve sudden or severe rash/hives, skin peeling, or any reaction on the inside of your mouth or nose? Y Did you need to seek medical attention at a hospital or doctor's office? N When did it last happen?  Over 30 years   If all above answers are "NO", may proceed with cephalosporin use.     Family History  Problem Relation Age of Onset   Rheumatic fever Sister    Stroke Brother     Prior to Admission medications   Medication Sig Start  Date End Date Taking? Authorizing Provider  CALCIUM-VITAMIN D PO Take 1 tablet by mouth every evening.    [provider]  Misc Natural Products (WH[place] BARK PO) Take 1 tablet by mouth daily.    [provider]  Multiple Vitamins-Minerals (MULTIVITAMIN ADULTS) TABS Take 1 tablet by mouth daily.    [provider]    Physical Exam: Vitals:   04/06/22 2212 04/07/22 0000 04/07/22 0249 04/07/22 0514  BP: (!) 123/108 (!) 118/104 96/83 130/83  Pulse: 96  71 (!) 56  Resp: 16 20 18 20   Temp:  98 F (36.7 C) 98.1 F (36.7 C) 98 F (36.7 C)  TempSrc:  Oral Oral Oral  SpO2: 97% 95% 96% 94%  Weight:  79.2 kg    Height:  5\' 4"  (1.626 m)     General: 88 y.o. female resting in bed in NAD Eyes: PERRL, normal sclera ENMT: Nares patent w/o discharge, orophaynx clear, dentition normal, ears w/o discharge/lesions/ulcers Neck: Supple, trachea midline Cardiovascular: RRR, +S1, S2, no m/g/r, equal pulses throughout Respiratory: CTABL, no w/r/r, normal WOB GI: BS+, ND, TTP RLQ/LLQ, no masses noted, no organomegaly noted MSK: No e/c/c Neuro: A&O x 3, no focal deficits Psyc: Appropriate interaction and affect, calm/cooperative  Data Reviewed:  Na+  142 K+  3.8 Glucose  101 BUN  11 SCr  0.65 WBC  4.3 Hgb  14.1 Plt  182  CT ab/pelvis w/ contrast 1. Circumferential rectal wall thickening as well as area of  wall thickening and edema involving the mid proximal sigmoid colon, suspicious for proctitis and colitis. Consider direct visualization with colonoscopy after course of treatment to exclude underlying colonic neoplasm. 2. Moderate small left pleural effusion with adjacent compressive atelectasis. 3. Hepatic steatosis. Subcentimeter enhancing focus in the central right lobe is too small to characterize. Given size, this cannot be further characterize, no dedicated follow-up recommended. 4. Small hiatal hernia.  CXR No active cardiopulmonary disease. Mild  cardiomegaly.  Assessment and Plan: Colitis/proctitis     - admit to inpt, tele     - started on flagyl, cefepime; can continue for now     - anti-emetics, pain control     - she's not had any diarrhea or fevers, and her white count is normal; so holding on stool studies     - will complete round of abx; as her stomach improves, she will need a GI consult  Tachy-brady syndrome PAF Hx of DVT     - looks like she is only on xarelto; will continue for now  Unintentional weight loss     - she reports 53lbs unintentional wt loss in last 7 months     - will need GI eval; let's get her colitis under control first  Advance Care Planning:   Code Status: FULL  Consults: None  Family Communication: None at bedside  Severity of Illness: The appropriate patient status for this patient is INPATIENT. Inpatient status is judged to be reasonable and necessary in order to provide the required intensity of service to ensure the patient's safety. The patient's presenting symptoms, physical exam findings, and initial radiographic and laboratory data in the context of their chronic comorbidities is felt to place them at high risk for further clinical deterioration. Furthermore, it is not anticipated that the patient will be medically stable for discharge from the hospital within 2 midnights of admission.   * I certify that at the point of admission it is my clinical judgment that the patient will require inpatient hospital care spanning beyond 2 midnights from the point of admission due to high intensity of service, high risk for further deterioration and high frequency of surveillance required.*  Author: Teddy Spike, DO 04/07/2022 7:23 AM  For on call review [email or web address].

## 2022-04-07 NOTE — Progress Notes (Addendum)
Initial Nutrition Assessment  DOCUMENTATION CODES:   Non-severe (moderate) malnutrition in context of chronic illness  INTERVENTION:  - will order [employer] Breakfast with 2% milk TID, each supplement provides 260 kcal and 13 grams protein.  - will order 1 tablet multivitamin with minerals/day.   - will check iron/anemia panel on 7/12 AM.    NUTRITION DIAGNOSIS:   Moderate Malnutrition related to chronic illness as evidenced by mild muscle depletion, moderate fat depletion.  GOAL:   Patient will meet greater than or equal to 90% of their needs  MONITOR:   PO intake, Supplement acceptance, Labs, Weight trends  REASON FOR ASSESSMENT:   Malnutrition Screening Tool, Consult Assessment of nutrition requirement/status  ASSESSMENT:   88 y.o. female with medical history of tachy-brady syndrome, afib on xarelto, and DVT. She presented to the ED due to abdominal pain x1 month that was intermittent and sharp in nature, mainly in the lower quadrants. She reported fatigue and decreased appetite along with 53 lb unintentional weight loss since Christmas. She was admitted due to proctitis.  Patient sitting up in bed eating an early dinner. Her sister was at bedside. Patient shares that she had severe abdominal pain at home that was not impacted by PO intakes.  She reports that she was eating 2-3 meals/day at home, but that she was experiencing early satiety so portions were small.   She has been feeling progressively weak and does require a cane for ambulation assistance.  Patient reports that abdominal pain has improved today.  At home she was sometimes drinking protein powder mixed into milk. She is agreeable to something similar here.  Patient reports that she has lost 53-54 lb since Christmas and that weight at that time was ~228 lb.  Weight today documented as 175 lb. This would indicate 53 lb weight loss (23% body weight) in the past 7 months; significant for time  frame.  PTA the most recently documented weight was 199 lb on 06/05/2020.   Labs reviewed; Ca: 8.7 mg/dl.  Medications reviewed. IVF; LR @ 100 ml/hr.   NUTRITION - FOCUSED PHYSICAL EXAM:  Flowsheet Row Most Recent Value  Orbital Region Moderate depletion  Upper Arm Region Mild depletion  Thoracic and Lumbar Region Unable to assess  Buccal Region Moderate depletion  Temple Region Mild depletion  Clavicle Bone Region Mild depletion  Clavicle and Acromion Bone Region Moderate depletion  Scapular Bone Region Mild depletion  Dorsal Hand Mild depletion  Patellar Region Mild depletion  Anterior Thigh Region Mild depletion  Posterior Calf Region No depletion  Edema (RD Assessment) Mild  [BLE]  Hair Reviewed  Eyes Reviewed  Mouth Reviewed  Skin Reviewed  Nails Reviewed       Diet Order:   Diet Order             Diet Heart Room service appropriate? Yes; Fluid consistency: Thin  Diet effective now                   EDUCATION NEEDS:   Education needs have been addressed  Skin:  Skin Assessment: Reviewed RN Assessment  Last BM:  7/11 (type 5 x2, small amounts)  Height:   Ht Readings from Last 1 Encounters:  04/07/22 5\' 4"  (1.626 m)    Weight:   Wt Readings from Last 1 Encounters:  04/07/22 79.2 kg     BMI:  Body mass index is 29.97 kg/m.  Estimated Nutritional Needs:  Kcal:  1700-1900 kcal Protein:  85-95 grams Fluid:  >/=  1.8 L/day     Trenton Gammon, MS, RD, LDN, CNSC Registered Dietitian II Inpatient Clinical Nutrition RD pager # and on-call/weekend pager # available in [facility]

## 2022-04-07 NOTE — Progress Notes (Addendum)
Pharmacy Antibiotic Note  Angela Leach is a 88 y.o. female admitted on 04/06/2022 with  colitis .  Pharmacy has been consulted for Cefepime dosing.  Active Problem(s): worsening abdominal pain for months. Also c/o weight loss. Possible colitis.In afib.   PMH: atrial fibrillation, prior DVT.   ID: Colitis. - Afebrile. WBC WNL, Scr <1  Cefepime 7/11>> Flagyl 7/11>>  Plan: Cefepime 2g IV q12 hrs. Need to resume home Xarelto    Height: 5\' 4"  (162.6 cm) Weight: 79.2 kg (174 lb 9.7 oz) IBW/kg (Calculated) : 54.7  Temp (24hrs), Avg:98.1 F (36.7 C), Min:98 F (36.7 C), Max:98.2 F (36.8 C)  Recent Labs  Lab 04/06/22 1431 04/06/22 1528  WBC 4.3  --   CREATININE  --  0.65    Estimated Creatinine Clearance: 50.4 mL/min (by C-G formula based on SCr of 0.65 mg/dL).    Allergies  Allergen Reactions   Penicillins Hives and Rash    Did it involve swelling of the face/tongue/throat, SOB, or low BP? Y Did it involve sudden or severe rash/hives, skin peeling, or any reaction on the inside of your mouth or nose? Y Did you need to seek medical attention at a hospital or doctor's office? N When did it last happen?  Over 30 years   If all above answers are "NO", may proceed with cephalosporin use.     Angela Leach S. 06/07/22, PharmD, BCPS Clinical Staff Pharmacist [email or web address]  Merilynn Finland 04/07/2022 7:56 AM

## 2022-04-08 DIAGNOSIS — K6289 Other specified diseases of anus and rectum: Secondary | ICD-10-CM | POA: Diagnosis not present

## 2022-04-08 DIAGNOSIS — E440 Moderate protein-calorie malnutrition: Secondary | ICD-10-CM

## 2022-04-08 DIAGNOSIS — I480 Paroxysmal atrial fibrillation: Secondary | ICD-10-CM

## 2022-04-08 DIAGNOSIS — Z86718 Personal history of other venous thrombosis and embolism: Secondary | ICD-10-CM | POA: Diagnosis not present

## 2022-04-08 DIAGNOSIS — I495 Sick sinus syndrome: Secondary | ICD-10-CM

## 2022-04-08 LAB — COMPREHENSIVE METABOLIC PANEL
ALT: 14 U/L (ref 0–44)
AST: 19 U/L (ref 15–41)
Albumin: 2.7 g/dL — ABNORMAL LOW (ref 3.5–5.0)
Alkaline Phosphatase: 49 U/L (ref 38–126)
Anion gap: 7 (ref 5–15)
BUN: 11 mg/dL (ref 8–23)
CO2: 25 mmol/L (ref 22–32)
Calcium: 8.4 mg/dL — ABNORMAL LOW (ref 8.9–10.3)
Chloride: 108 mmol/L (ref 98–111)
Creatinine, Ser: 0.62 mg/dL (ref 0.44–1.00)
GFR, Estimated: >60 mL/min (ref >60)
Glucose, Bld: 99 mg/dL (ref 70–99)
Potassium: 3.3 mmol/L — ABNORMAL LOW (ref 3.5–5.1)
Sodium: 140 mmol/L (ref 135–145)
Total Bilirubin: 0.7 mg/dL (ref 0.3–1.2)
Total Protein: 5.9 g/dL — ABNORMAL LOW (ref 6.5–8.1)

## 2022-04-08 LAB — CBC
HCT: 40.6 % (ref 36.0–46.0)
Hemoglobin: 13.4 g/dL (ref 12.0–15.0)
MCH: 31.5 pg (ref 26.0–34.0)
MCHC: 33 g/dL (ref 30.0–36.0)
MCV: 95.5 fL (ref 80.0–100.0)
Platelets: 153 10*3/uL (ref 150–400)
RBC: 4.25 MIL/uL (ref 3.87–5.11)
RDW: 14.7 % (ref 11.5–15.5)
WBC: 3.6 10*3/uL — ABNORMAL LOW (ref 4.0–10.5)
nRBC: 0 % (ref 0.0–0.2)

## 2022-04-08 LAB — IRON AND TIBC
Iron: 84 ug/dL (ref 28–170)
Saturation Ratios: 36 % — ABNORMAL HIGH (ref 10.4–31.8)
TIBC: 233 ug/dL — ABNORMAL LOW (ref 250–450)
UIBC: 149 ug/dL

## 2022-04-08 LAB — FOLATE: Folate: 14 ng/mL (ref >5.9)

## 2022-04-08 LAB — VITAMIN B12: Vitamin B-12: 840 pg/mL (ref 180–914)

## 2022-04-08 LAB — RETICULOCYTES
Immature Retic Fract: 6.4 % (ref 2.3–15.9)
RBC.: 4.31 MIL/uL (ref 3.87–5.11)
Retic Count, Absolute: 36.6 10*3/uL (ref 19.0–186.0)
Retic Ct Pct: 0.9 % (ref 0.4–3.1)

## 2022-04-08 LAB — FERRITIN: Ferritin: 140 ng/mL (ref 11–307)

## 2022-04-08 MED ORDER — POLYETHYLENE GLYCOL 3350 17 G PO PACK: 17.0000 g | PACK | Freq: Every day | ORAL | Status: DC

## 2022-04-08 MED ORDER — PEG 3350-KCL-NA BICARB-NACL 420 G PO SOLR
4000.0000 mL | Freq: Once | ORAL | Status: AC
  Administered 2022-04-08: 4000 mL via ORAL

## 2022-04-08 MED ORDER — SODIUM CHLORIDE 0.9 % IV SOLN: INTRAVENOUS | Status: DC

## 2022-04-08 NOTE — Progress Notes (Signed)
  Progress Note   Patient: Angela Leach ZOX:[id number] DOB: Nov 08, 1932 DOA: 04/06/2022     1 DOS: the patient was seen and examined on 04/08/2022 at 8:46 AM      Brief hospital course: Angela Leach is an 88 y.o. F with AF and sick sinus syndrome and MR, hx DVT now on Xarelto, who presented with 1 month intermittent abdominal pain, 53 lb weight loss, and fatigue/anorexia.  In the ER, CT showed circumferential rectal wall thickening and edema involving the mid proximal sigmoid colon, suspicious for proctitis and colitis.  She was started on antibiotics and admitted.     Assessment and Plan: * Proctitis CT shows thickening of the rectum and colon.  Her clinical syndrome is >2 months of symptoms, weight loss, decreased appetite and pain.  Having only 1 BM per day, small volume, soft to liquid, no blood.  There are no recent constitutional symptoms, no acute increase in stool frequency,, and nighttime course not consistent with an infectious colitis. -Consult GI to help differentiate inflammatory from malignant bowel thickening  Malnutrition of moderate degree As evidenced by 23% weight loss in the last 6 months, poor oral intake, mild loss of subcutaneous muscle mass and  Unintentional weight loss Due to her bowel process  PAF (paroxysmal atrial fibrillation) (HCC) Intolerant of rate control agents - Continue Xarelto -We will discuss timing of colonoscopy and holding Xarelto with GI  Tachycardia-bradycardia syndrome (HCC) Asymptomatic          Subjective: Patient had no bowel movements overnight.  Her abdominal pain is slightly better.  She had no fever.  No confusion.  No vomiting.     Physical Exam: Vitals:   04/07/22 1651 04/07/22 2121 04/08/22 0705 04/08/22 0838  BP: 97/63 112/82 (!) 131/107 120/85  Pulse: 87 94 (!) 43 88  Resp: 20 18 16 17   Temp: 98.1 F (36.7 C) 98.1 F (36.7 C) 97.7 F (36.5 C) 98.1 F (36.7 C)  TempSrc: Oral Oral Oral Oral  SpO2: 93% 96%  94% 94%  Weight:      Height:       Thin adult female, lying in bed, no acute distress RRR, no murmurs, no peripheral edema Respiratory rate normal, lungs clear without rales or wheezes She has some mild tenderness to deep palpation in the right lower quadrant, and left lower quadrant, no guarding, no rebound, no rigidity Attention normal, affect normal, judgment and insight appear normal, face symmetric, speech fluent  Data Reviewed: Basic metabolic panel notable for normal sodium and creatinine, potassium 3.3 CT abdomen and pelvis report reviewed, summarized above Iron studies normal B12 normal Chest x-ray normal Hemogram shows mild leukopenia, normal hemoglobin   Disposition: Status is: Inpatient         Author: , MD 04/08/2022 9:04 AM  For on call review [email or web address].

## 2022-04-08 NOTE — Progress Notes (Signed)
Patient with HR fluctuating from 100-160's with activity.  NP on call notified and asked about treatment.  No orders received as patient has "hx of tachy-brady syndrome".  Prior treatment dropped HR substantially per NP.  Continue to monitor as patient is asymptomatic at this time.

## 2022-04-08 NOTE — Assessment & Plan Note (Addendum)
Intolerant of rate control agents - Continue Xarelto -We will discuss timing of colonoscopy and holding Xarelto with GI

## 2022-04-08 NOTE — Assessment & Plan Note (Signed)
On Xarelto 

## 2022-04-08 NOTE — Assessment & Plan Note (Signed)
Due to her bowel process

## 2022-04-08 NOTE — Assessment & Plan Note (Signed)
CT shows thickening of the rectum and colon.  Her clinical syndrome is >2 months of symptoms, weight loss, decreased appetite and pain.  Having only 1 BM per day, small volume, soft to liquid, no blood.  There are no recent constitutional symptoms, no acute increase in stool frequency,, and nighttime course not consistent with an infectious colitis. -Consult GI to help differentiate inflammatory from malignant bowel thickening

## 2022-04-08 NOTE — Assessment & Plan Note (Signed)
Asymptomatic. 

## 2022-04-08 NOTE — Hospital Course (Addendum)
Angela Leach is an 88 y.o. F with AF and sick sinus syndrome and MR, hx DVT now on Xarelto, who presented with 1 month intermittent abdominal pain, 53 lb weight loss, and fatigue/anorexia.  In the ER, CT showed circumferential rectal wall thickening and edema involving the mid proximal sigmoid colon, suspicious for proctitis and colitis.  She was started on antibiotics and admitted.

## 2022-04-08 NOTE — Assessment & Plan Note (Signed)
As evidenced by 23% weight loss in the last 6 months, poor oral intake, mild loss of subcutaneous muscle mass and

## 2022-04-08 NOTE — Consult Note (Addendum)
Referring Provider: [facility] Primary Care Physician:  No primary care provider on file. Primary Gastroenterologist:  Gentry Fitz  Reason for Consultation:  Abnormal CT (proctitis/colitis)  HPI: Angela Leach is a 88 y.o. female F with AF and sick sinus syndrome and MR, hx DVT now on Xarelto presents for evaluation of abnormal CT.  Patient presented to ED with 1 month intermittent abdominal pain, fatigue, and unintentional weight loss of 28-30lbs. Reports unintentional weight loss since Christmas. CT showed circumferential rectal wall thickening and edema involving the mid proximal sigmoid colon, suspicious for proctitis and colitis.    Patient states abdominal pain is in her lower abdomen.  Nothing makes it worse.  Heat makes it better.  States it is a constant pain.  Reports longstanding history of constipation that has progressively gotten worse over the last month.  States 6 weeks ago she had an episode of nausea with vomiting of 9 times, nonbloody bilious emesis.  No further nausea and vomiting.  Denies dysphagia.  Denies NSAIDs.  Denies alcohol and tobacco use.  Last dose of Xarelto was 04/07/2022 at 10 AM.  Denies family history of colon cancer or other GI malignancies. Denies melena/hematochezia.  ECHO 2021: 55-60%  Last colonoscopy 04/27/2011 with Dr. Randa Evens: 1 hyperplastic polyp, diverticulosis, internal hemorrhoids, repeat 5 years  Previous colonoscopies 2007, 2003, 1998, 1995, 1992, 1991. 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 No pertinent surgical history.  Prior to Admission medications   Medication Sig Start Date End Date Taking? Authorizing Provider  acetaminophen (TYLENOL) 500 MG tablet Take 500 mg by mouth once.   Yes [provider]  acetaminophen (TYLENOL) 650 MG CR tablet Take 650 mg by mouth once.   Yes [provider]  CALCIUM-VITAMIN D PO Take 1 tablet by mouth at bedtime.   Yes [provider]  XARELTO 20 MG TABS tablet Take 20 mg by mouth daily. 02/20/22  Yes [provider]  Multiple Vitamins-Minerals (MULTIVITAMIN ADULTS) TABS Take 1 tablet by mouth daily. Patient not taking: Reported on 04/07/2022    [provider]  ondansetron (ZOFRAN) 4 MG tablet Take 4 mg by mouth every 6 (six) hours as needed. Patient not taking: Reported on 04/07/2022 12/11/21   [provider]  promethazine (PHENERGAN) 12.5 MG tablet Take 12.5 mg by mouth every 6 (six) hours as needed. Patient not taking: Reported on 04/07/2022 12/29/21   [provider]    Scheduled Meds:  multivitamin with minerals  1 tablet Oral Daily   Continuous Infusions:  lactated ringers 100 mL/hr at 04/07/22 0821   PRN Meds:.acetaminophen, ondansetron (ZOFRAN) IV, oxyCODONE  Allergies as of 04/06/2022 - Review Complete 04/06/2022  Allergen Reaction Noted   Penicillins  02/04/2015    Family History  Problem Relation Age of Onset   Rheumatic fever Sister    Stroke Brother     Social History   Socioeconomic History   Marital status: Married    Spouse name: Not on file   Number of children: Not on file   Years of education: Not on file   Highest education level: Not on file  Occupational History   Not on file  Tobacco Use   Smoking status: Never   Smokeless tobacco: Never  Substance and Sexual Activity   Alcohol use: Not on file   Drug use: Not on file   Sexual activity:  Not on file  Other Topics Concern   Not on file  Social History Narrative   Not on file   Social Determinants of Health   Financial Resource Strain: Not on file  Food Insecurity: Not on file  Transportation Needs: Not on file  Physical Activity: Not on file  Stress: Not on file  Social Connections: Not on file  Intimate Partner Violence: Not on file    Review of Systems: Review of Systems   Constitutional:  Positive for malaise/fatigue and weight loss. Negative for chills and fever.  HENT:  Negative for hearing loss and tinnitus.   Eyes:  Negative for blurred vision and double vision.  Respiratory:  Negative for cough and hemoptysis.   Cardiovascular:  Negative for chest pain and palpitations.  Gastrointestinal:  Positive for abdominal pain and constipation. Negative for blood in stool, diarrhea, heartburn, melena, nausea and vomiting.  Genitourinary:  Negative for dysuria and urgency.  Musculoskeletal:  Negative for myalgias and neck pain.  Skin:  Negative for itching and rash.  Neurological:  Negative for seizures and loss of consciousness.  Psychiatric/Behavioral:  Negative for depression and suicidal ideas.      Physical Exam:Physical Exam Constitutional:      Appearance: She is well-developed and normal weight.  HENT:     Head: Normocephalic and atraumatic.     Nose: Nose normal. No congestion.     Mouth/Throat:     Mouth: Mucous membranes are moist.     Pharynx: Oropharynx is clear.  Eyes:     Extraocular Movements: Extraocular movements intact.     Conjunctiva/sclera: Conjunctivae normal.  Cardiovascular:     Rate and Rhythm: Normal rate and regular rhythm.  Pulmonary:     Effort: Pulmonary effort is normal. No respiratory distress.  Abdominal:     General: Bowel sounds are normal. There is no distension.     Palpations: Abdomen is soft. There is no mass.     Tenderness: There is abdominal tenderness (lower abdominal tenderness). There is no guarding or rebound.     Hernia: No hernia is present.  Musculoskeletal:        General: No swelling. Normal range of motion.     Cervical back: Normal range of motion and neck supple.  Skin:    General: Skin is warm and dry.  Neurological:     General: No focal deficit present.     Mental Status: She is alert and oriented to person, place, and time.  Psychiatric:        Mood and Affect: Mood normal.         Behavior: Behavior normal.        Thought Content: Thought content normal.        Judgment: Judgment normal.     Vital signs: Vitals:   04/08/22 0705 04/08/22 0838  BP: (!) 131/107 120/85  Pulse: (!) 43 88  Resp: 16 17  Temp: 97.7 F (36.5 C) 98.1 F (36.7 C)  SpO2: 94% 94%   Last BM Date : 04/07/22    GI:  Lab Results: Recent Labs    04/06/22 1431 04/08/22 0536  WBC 4.3 3.6*  HGB 14.1 13.4  HCT 42.6 40.6  PLT 182 153   BMET Recent Labs    04/06/22 1528 04/08/22 0536  NA 142 140  K 3.8 3.3*  CL 104 108  CO2 29 25  GLUCOSE 101* 99  BUN 11 11  CREATININE 0.65 0.62  CALCIUM 8.7* 8.4*   LFT  Recent Labs    04/08/22 0536  PROT 5.9*  ALBUMIN 2.7*  AST 19  ALT 14  ALKPHOS 49  BILITOT 0.7   PT/INR No results for input(s): "LABPROT", "INR" in the last 72 hours.   Studies/Results: DG Chest 2 View  Result Date: 04/06/2022 CLINICAL DATA:  Atrial fibrillation. EXAM: CHEST - 2 VIEW COMPARISON:  None Available. FINDINGS: Heart is mildly enlarged. Both lungs are clear. The visualized skeletal structures are unremarkable. IMPRESSION: No active cardiopulmonary disease. Mild cardiomegaly. Electronically Signed   By: Darliss Cheney M.D.   On: 04/06/2022 18:39   CT ABDOMEN PELVIS W CONTRAST  Result Date: 04/06/2022 CLINICAL DATA:  88 year old with abdominal pain for 2 months. Decreased p.o. intake. EXAM: CT ABDOMEN AND PELVIS WITH CONTRAST TECHNIQUE: Multidetector CT imaging of the abdomen and pelvis was performed using the standard protocol following bolus administration of intravenous contrast. RADIATION DOSE REDUCTION: This exam was performed according to the departmental dose-optimization program which includes automated exposure control, adjustment of the mA and/or kV according to patient size and/or use of iterative reconstruction technique. CONTRAST:  OMNIPAQUE IOHEXOL 300 MG/ML  SOLN COMPARISON:  None Available. FINDINGS: Lower chest: Moderate small left  pleural effusion with adjacent compressive atelectasis. Heart is normal in size. Hepatobiliary: Decreased hepatic density typical of steatosis. Subcentimeter enhancing focus in the central right lobe series 2, image 24, too small to characterize. Cholecystectomy. 10 mm common bile duct is normal post cholecystectomy. No visualized choledocholithiasis. Pancreas: Diffuse fatty atrophy. No ductal dilatation or inflammation. Spleen: Normal in size without focal abnormality. Adrenals/Urinary Tract: Normal adrenal glands. No hydronephrosis. No renal calculi or focal lesion. Partially distended urinary bladder, possible mild wall thickening at the dome. Stomach/Bowel: There is circumferential rectal wall thickening, series 2, image 71. There also areas of wall thickening involving the mid proximal sigmoid colon, series 2, image 66 with adjacent pericolonic edema. Small to moderate volume of stool in the remainder of the colon, there is colonic tortuosity. The appendix is not definitively visualized. No small bowel obstruction or inflammatory change. Small hiatal hernia, otherwise unremarkable appearance of the stomach. Vascular/Lymphatic: Aortic atherosclerosis. No aortic aneurysm. No portal venous or mesenteric gas. No bulky abdominopelvic adenopathy. Reproductive: Mild quiescent uterus with slight periuterine stranding, like reactive due to adjacent colonic fat stranding. No adnexal mass. Other: Trace free fluid in the pelvis. Moderate presacral edema. 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 No abdominal wall hernia. Musculoskeletal: The bones are under mineralized. Faint sclerotic focus in the mid sacrum is likely a bone island. Mild scoliosis and diffuse degenerative change in the spine. IMPRESSION: 1. Circumferential rectal wall thickening as well as area of wall thickening and edema involving the mid proximal  sigmoid colon, suspicious for proctitis and colitis. Consider direct visualization with colonoscopy after course of treatment to exclude underlying colonic neoplasm. 2. Moderate small left pleural effusion with adjacent compressive atelectasis. 3. Hepatic steatosis. Subcentimeter enhancing focus in the central right lobe is too small to characterize. Given size, this cannot be further characterize, no dedicated follow-up recommended. 4. Small hiatal hernia. Aortic Atherosclerosis (ICD10-I70.0). Electronically Signed   By: Narda Rutherford M.D.   On: 04/06/2022 18:18    Impression: Abdominal pain, weight loss, abnormal CT - CT showed circumferential rectal wall thickening and edema involving the mid proximal sigmoid colon, suspicious for proctitis and colitis.   - hgb 13.4 - Normal LFTs -  Normal kidney function - normal iron studies   Plan: Plan for Flexible sigmoidoscopy/colonoscopy tomorrow. I thoroughly discussed the procedures to include nature, alternatives, benefits, and risks including but not limited to bleeding, perforation, infection, anesthesia/cardiac and pulmonary complications. Patient provides understanding and gave verbal consent to proceed. Nulytely prep Continue supportive care Continue to hold Xarelto [facility] will follow.   LOS: 1 day   Legrand Como  PA-C 04/08/2022, 9:34 AM  Contact #  [phone]

## 2022-04-09 ENCOUNTER — Inpatient Hospital Stay (HOSPITAL_COMMUNITY): Payer: Medicare HMO | Admitting: Certified Registered Nurse Anesthetist

## 2022-04-09 ENCOUNTER — Encounter (HOSPITAL_COMMUNITY): Payer: Self-pay | Admitting: Internal Medicine

## 2022-04-09 ENCOUNTER — Encounter (HOSPITAL_COMMUNITY)
Admission: EM | Disposition: A | Discharge status: Home / Self Care | Payer: Self-pay | Source: Home / Self Care | Attending: Family Medicine

## 2022-04-09 DIAGNOSIS — K6289 Other specified diseases of anus and rectum: Secondary | ICD-10-CM

## 2022-04-09 DIAGNOSIS — I4891 Unspecified atrial fibrillation: Secondary | ICD-10-CM

## 2022-04-09 DIAGNOSIS — N289 Disorder of kidney and ureter, unspecified: Secondary | ICD-10-CM

## 2022-04-09 DIAGNOSIS — K56699 Other intestinal obstruction unspecified as to partial versus complete obstruction: Secondary | ICD-10-CM

## 2022-04-09 HISTORY — PX: FLEXIBLE SIGMOIDOSCOPY: SHX5431

## 2022-04-09 HISTORY — PX: BIOPSY: SHX5522

## 2022-04-09 LAB — CBC
HCT: 41.6 % (ref 36.0–46.0)
Hemoglobin: 13.5 g/dL (ref 12.0–15.0)
MCH: 31.5 pg (ref 26.0–34.0)
MCHC: 32.5 g/dL (ref 30.0–36.0)
MCV: 97 fL (ref 80.0–100.0)
Platelets: 154 10*3/uL (ref 150–400)
RBC: 4.29 MIL/uL (ref 3.87–5.11)
RDW: 14.9 % (ref 11.5–15.5)
WBC: 3.9 10*3/uL — ABNORMAL LOW (ref 4.0–10.5)
nRBC: 0 % (ref 0.0–0.2)

## 2022-04-09 LAB — BASIC METABOLIC PANEL
Anion gap: 8 (ref 5–15)
BUN: 8 mg/dL (ref 8–23)
CO2: 27 mmol/L (ref 22–32)
Calcium: 8.6 mg/dL — ABNORMAL LOW (ref 8.9–10.3)
Chloride: 109 mmol/L (ref 98–111)
Creatinine, Ser: 0.63 mg/dL (ref 0.44–1.00)
GFR, Estimated: >60 mL/min (ref >60)
Glucose, Bld: 96 mg/dL (ref 70–99)
Potassium: 4.2 mmol/L (ref 3.5–5.1)
Sodium: 144 mmol/L (ref 135–145)

## 2022-04-09 LAB — TSH: TSH: 3.266 u[IU]/mL (ref 0.350–4.500)

## 2022-04-09 SURGERY — SIGMOIDOSCOPY, FLEXIBLE
Anesthesia: Monitor Anesthesia Care

## 2022-04-09 MED ORDER — PROPOFOL 500 MG/50ML IV EMUL
INTRAVENOUS | Status: DC | PRN
  Administered 2022-04-09: 75 ug/kg/min via INTRAVENOUS

## 2022-04-09 MED ORDER — ESMOLOL HCL 100 MG/10ML IV SOLN
INTRAVENOUS | Status: DC | PRN
  Administered 2022-04-09 (×4): 10 mg via INTRAVENOUS

## 2022-04-09 MED ORDER — PHENYLEPHRINE 80 MCG/ML (10ML) SYRINGE FOR IV PUSH (FOR BLOOD PRESSURE SUPPORT)
PREFILLED_SYRINGE | INTRAVENOUS | Status: DC | PRN
  Administered 2022-04-09 (×2): 80 ug via INTRAVENOUS
  Administered 2022-04-09 (×3): 160 ug via INTRAVENOUS
  Administered 2022-04-09: 80 ug via INTRAVENOUS
  Administered 2022-04-09 (×2): 120 ug via INTRAVENOUS
  Administered 2022-04-09: 80 ug via INTRAVENOUS

## 2022-04-09 MED ORDER — POLYETHYLENE GLYCOL 3350 17 G PO PACK
17.0000 g | PACK | Freq: Every day | ORAL | Status: DC
  Administered 2022-04-10: 17 g via ORAL
  Filled 2022-04-09: qty 1

## 2022-04-09 MED ORDER — LIDOCAINE 2% (20 MG/ML) 5 ML SYRINGE
INTRAMUSCULAR | Status: DC | PRN
  Administered 2022-04-09: 40 mg via INTRAVENOUS

## 2022-04-09 MED ORDER — LACTATED RINGERS IV SOLN: INTRAVENOUS | Status: DC

## 2022-04-09 MED ORDER — LACTATED RINGERS IV SOLN: INTRAVENOUS | Status: DC | PRN

## 2022-04-09 MED ORDER — PROPOFOL 10 MG/ML IV BOLUS
INTRAVENOUS | Status: DC | PRN
  Administered 2022-04-09: 20 mg via INTRAVENOUS
  Administered 2022-04-09: 30 mg via INTRAVENOUS
  Administered 2022-04-09: 20 mg via INTRAVENOUS

## 2022-04-09 NOTE — Anesthesia Preprocedure Evaluation (Addendum)
Anesthesia Evaluation  Patient identified by MRN, date of birth, ID band Patient awake    Reviewed: Allergy & Precautions, NPO status , Patient's Chart, lab work & pertinent test results  Airway Mallampati: II  TM Distance: >3 FB Neck ROM: Full    Dental  (+) Teeth Intact, Dental Advisory Given   Pulmonary neg pulmonary ROS,    Pulmonary exam normal breath sounds clear to auscultation       Cardiovascular (-) hypertension(-) angina+ dysrhythmias (Sick sinus syndrome) Atrial Fibrillation  Rhythm:Irregular Rate:Abnormal     Neuro/Psych negative neurological ROS     GI/Hepatic Neg liver ROS, weight loss, lower abdominal pain, worsening constipation, abnormal CT   Endo/Other  negative endocrine ROS  Renal/GU Renal InsufficiencyRenal disease     Musculoskeletal negative musculoskeletal ROS (+)   Abdominal   Peds  Hematology  (+) Blood dyscrasia (Xarelto), ,   Anesthesia Other Findings Day of surgery medications reviewed with the patient.  Reproductive/Obstetrics                           Anesthesia Physical Anesthesia Plan  ASA: 3  Anesthesia Plan: MAC   Post-op Pain Management: Minimal or no pain anticipated   Induction: Intravenous  PONV Risk Score and Plan: 2 and TIVA and Treatment may vary due to age or medical condition  Airway Management Planned: Nasal Cannula and Natural Airway  Additional Equipment:   Intra-op Plan:   Post-operative Plan:   Informed Consent: I have reviewed the patients History and Physical, chart, labs and discussed the procedure including the risks, benefits and alternatives for the proposed anesthesia with the patient or authorized representative who has indicated his/her understanding and acceptance.     Dental advisory given  Plan Discussed with: CRNA and Anesthesiologist  Anesthesia Plan Comments: (Tachycardia discussed with GI proceduralist.   Patient asymptomatic.  Will attempt procedure under light sedation.)       Anesthesia Quick Evaluation

## 2022-04-09 NOTE — Transfer of Care (Signed)
Immediate Anesthesia Transfer of Care Note  Patient: Angela Leach  Procedure(s) Performed: FLEXIBLE SIGMOIDOSCOPY BIOPSY  Patient Location: PACU and Endoscopy Unit  Anesthesia Type:MAC  Level of Consciousness: sedated  Airway & Oxygen Therapy: Patient Spontanous Breathing and Patient connected to face mask oxygen  Post-op Assessment: Report given to RN and Post -op Vital signs reviewed and stable  Post vital signs: Reviewed and stable  Last Vitals:  Vitals Value Taken Time  BP 93/68 04/09/22 1521  Temp    Pulse 46 04/09/22 1523  Resp 26 04/09/22 1523  SpO2 98 % 04/09/22 1523  Vitals shown include unvalidated device data.  Last Pain:  Vitals:   04/09/22 1521  TempSrc:   PainSc: Asleep         Complications: No notable events documented.

## 2022-04-09 NOTE — Plan of Care (Signed)
  Problem: Education: Goal: Knowledge of General Education information will improve Description: Including pain rating scale, medication(s)/side effects and non-pharmacologic comfort measures Outcome: Progressing   Problem: Health Behavior/Discharge Planning: Goal: Ability to manage health-related needs will improve Outcome: Progressing   Problem: Clinical Measurements: Goal: Ability to maintain clinical measurements within normal limits will improve Outcome: Progressing Goal: Will remain free from infection Outcome: Progressing Goal: Diagnostic test results will improve Outcome: Progressing   Problem: Activity: Goal: Risk for activity intolerance will decrease Outcome: Progressing   Problem: Nutrition: Goal: Adequate nutrition will be maintained Outcome: Progressing   Problem: Coping: Goal: Level of anxiety will decrease Outcome: Progressing   Problem: Elimination: Goal: Will not experience complications related to bowel motility Outcome: Progressing Note: Bowel prep in process, scheduled for flex sig today Goal: Will not experience complications related to urinary retention Outcome: Progressing   Problem: Clinical Measurements: Goal: Respiratory complications will improve Outcome: Adequate for Discharge Goal: Cardiovascular complication will be avoided Outcome: Adequate for Discharge

## 2022-04-09 NOTE — Progress Notes (Signed)
Patient tolerated colon prep without difficulty.  Reports having yellow/clear stools this morning.  Currently NPO.

## 2022-04-09 NOTE — Progress Notes (Signed)
  Progress Note   Patient: Angela Leach NTI:[id number] DOB: May 29, 1933 DOA: 04/06/2022     2 DOS: the patient was seen and examined on 04/09/2022        Brief hospital course: Angela Leach is an 88 y.o. F with AF and sick sinus syndrome and MR, hx DVT now on Xarelto, who presented with 1 month intermittent abdominal pain, 53 lb weight loss, and fatigue/anorexia.       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 - Start miralax - Clears tonight - Advance to soft tomorrow  History of DVT (deep vein thrombosis) - Resume Xarelto in 3 days  PAF (paroxysmal atrial fibrillation) (HCC) Intolerant of rate control agents - Continue Xarelto           Subjective: No abdominal pain, no hematochezia, no confusion.     Physical Exam: Vitals:   04/09/22 1522 04/09/22 1531 04/09/22 1540 04/09/22 1620  BP: 93/68 (!) 122/102 106/65 (!) 105/93  Pulse: (!) 149 (!) 143 (!) 134 75  Resp: 18 19 (!) 24 20  Temp: 98.2 F (36.8 C)   98.6 F (37 C)  TempSrc: Temporal   Oral  SpO2: 91% 99% 92% 94%  Weight:      Height:       Adult female, lying in bed, no acute distress Tachycardic, irregular, no murmurs, no peripheral edema Respiratory rate normal, lungs clear without rales or wheezes Abdomen soft without tenderness palpation or guarding, no ascites or distention  Data Reviewed: Creatinine normal, potassium normal  Family Communication: Angela Leach at the bedside    Disposition: Status is: Inpatient         Author: Alberteen Sam, MD 04/09/2022 4:46 PM  For on call review [email or web address].

## 2022-04-09 NOTE — Op Note (Signed)
[facility] Patient Name: Angela Leach Procedure Date: 04/09/2022 MRN: [id number] Attending MD: Vida Rigger , MD Date of Birth: 04-17-1933 CSN: [id number] Age: 88 Admit Type: Inpatient Procedure:                Flexible Sigmoidoscopy Indications:              Abnormal CT of the GI tract weight loss.                            Constipation Providers:                Vida Rigger, MD, Fransisca Connors, Kandice Robinsons,                            Technician Referring MD:              Medicines:                Monitored Anesthesia Care Complications:            No immediate complications. Estimated Blood Loss:     Estimated blood loss: none. Procedure:                Pre-Anesthesia Assessment:                           - Prior to the procedure, a History and Physical                            was performed, and patient medications and                            allergies were reviewed. The patient's tolerance of                            previous anesthesia was also reviewed. The risks                            and benefits of the procedure and the sedation                            options and risks were discussed with the patient.                            All questions were answered, and informed consent                            was obtained. Prior Anticoagulants: The patient has                            taken Xarelto (rivaroxaban), last dose was 2 days                            prior to procedure. ASA Grade Assessment: III - A                            patient  with severe systemic disease. After                            reviewing the risks and benefits, the patient was                            deemed in satisfactory condition to undergo the                            procedure.                           After obtaining informed consent, the scope was                            passed under direct vision. The [id number]-[id number]                            ([id number])  Olympus colonoscope was introduced                            through the anus and advanced to the strictured                            area but could not despite abdominal pressure                            advanced past it and the patient was having a                            moderate amount of discomfort so we changed to the                            endoscope and with mild difficulty but no                            discomfort for the patient we were able to advance                            to the splenic flexure. The [id number] ([id number])                            Olympus endoscope was introduced through the and                            advanced to the splenic flexure as above. The                            flexible sigmoidoscopy was somewhat difficult due                            to bowel stenosis as described below. Successful  completion of the procedure was aided by applying                            abdominal pressure. The patient tolerated the                            procedure fairly well except when trying to                            navigate the stricture as above. The flexible                            sigmoidoscopy was somewhat difficult due to bowel                            stenosis. Successful completion of the procedure                            was aided by withdrawing the scope and replacing                            with the adult endoscope. The quality of the bowel                            preparation was adequate. Scope In: 2:57:46 PM Scope Out: 3:14:08 PM Total Procedure Duration: 0 hours 16 minutes 22 seconds  Findings:      Hemorrhoids were found on perianal exam.      Anal papilla(e) were hypertrophied.      A moderate stenosis was found in the proximal sigmoid colon and was       traversed with changing to the adult endoscope as above. The stricture       was moderately long and characterized by spasm and edema  and biopsies       were taken with a cold forceps for histology.      The rectum, mid sigmoid colon, distal sigmoid colon, descending colon       and splenic flexure appeared normal.      The exam was otherwise without abnormality. Impression:               - Hemorrhoids found on perianal exam.                           - Anal papilla(e) were hypertrophied.                           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 Biopsied.                           - The rectum, mid sigmoid colon, distal sigmoid                            colon, descending colon and splenic flexure are  normal.                           - The examination was otherwise normal. Moderate Sedation:      Not Applicable - Patient had care per Anesthesia. Recommendation:           - Clear liquid diet today. If doing well in a.m.                            will probably slowly advance diet                           - Continue present medications. Probably resume                            Xarelto if she needs it going forward in a few days                           - Return to GI clinic PRN.                           - Telephone GI clinic for pathology results in 5                            days.                           - Telephone GI clinic if symptomatic PRN. Would use                            MiraLAX chronically and consider barium enema or                            even surgical consultation if symptoms continue Procedure Code(s):        --- Professional ---                           [id number], Sigmoidoscopy, flexible; with biopsy, single                            or multiple Diagnosis Code(s):        --- Professional ---                           K62.89, Other specified diseases of anus and rectum                           K56.699, Other intestinal obstruction unspecified                            as to partial versus complete obstruction                           R93.3, Abnormal  findings on diagnostic imaging of  other parts of digestive tract CPT copyright 2019 [employer]. All rights reserved. The codes documented in this report are preliminary and upon coder review may  be revised to meet current compliance requirements. Clarene Essex, MD 04/09/2022 3:31:31 PM This report has been signed electronically. Number of Addenda: 0

## 2022-04-09 NOTE — Anesthesia Procedure Notes (Signed)
Procedure Name: MAC Date/Time: 04/09/2022 2:48 PM  Performed by: Cynda Familia, CRNAPre-anesthesia Checklist: Patient identified, Emergency Drugs available, Suction available, Patient being monitored and Timeout performed Patient Re-evaluated:Patient Re-evaluated prior to induction Oxygen Delivery Method: Simple face mask Placement Confirmation: positive ETCO2 and breath sounds checked- equal and bilateral Dental Injury: Teeth and Oropharynx as per pre-operative assessment

## 2022-04-09 NOTE — Progress Notes (Signed)
Angela Leach 2:39 PM  Subjective: Patient seen and examined and did okay with her prep and did not see any blood and has no new complaints  Objective: Vital signs stable increased heart rate on monitor discussing with anesthesia no acute distress exam please see preassessment evaluation labs stable assessment: Abnormal CT questionable etiology  Plan: Okay to proceed with flex sig possible colonoscopy with anesthesia assistance  [facility] E  office [phone] After 5PM or if no answer call [phone]

## 2022-04-09 NOTE — Plan of Care (Signed)
  Problem: Education: Goal: Knowledge of General Education information will improve Description: Including pain rating scale, medication(s)/side effects and non-pharmacologic comfort measures 04/09/2022 0821 by Loel Ro, RN Outcome: Not Applicable 04/09/2022 0820 by Loel Ro, RN Outcome: Progressing   Problem: Health Behavior/Discharge Planning: Goal: Ability to manage health-related needs will improve 04/09/2022 0821 by Loel Ro, RN Outcome: Not Applicable 04/09/2022 0820 by Loel Ro, RN Outcome: Progressing   Problem: Clinical Measurements: Goal: Ability to maintain clinical measurements within normal limits will improve 04/09/2022 0821 by Loel Ro, RN Outcome: Not Applicable 04/09/2022 0820 by Loel Ro, RN Outcome: Progressing Goal: Will remain free from infection 04/09/2022 0821 by Loel Ro, RN Outcome: Not Applicable 04/09/2022 0820 by Loel Ro, RN Outcome: Progressing Goal: Diagnostic test results will improve 04/09/2022 0821 by Loel Ro, RN Outcome: Not Applicable 04/09/2022 0820 by Loel Ro, RN Outcome: Progressing Goal: Respiratory complications will improve 04/09/2022 0821 by Loel Ro, RN Outcome: Not Applicable 04/09/2022 0820 by Loel Ro, RN Outcome: Adequate for Discharge Goal: Cardiovascular complication will be avoided 04/09/2022 0821 by Loel Ro, RN Outcome: Not Applicable 04/09/2022 0820 by Loel Ro, RN Outcome: Adequate for Discharge   Problem: Activity: Goal: Risk for activity intolerance will decrease 04/09/2022 0821 by Loel Ro, RN Outcome: Not Applicable 04/09/2022 0820 by Loel Ro, RN Outcome: Progressing   Problem: Nutrition: Goal: Adequate nutrition will be maintained 04/09/2022 0821 by Loel Ro, RN Outcome: Not Applicable 04/09/2022 0820 by Loel Ro, RN Outcome: Progressing   Problem: Coping: Goal: Level of anxiety will  decrease 04/09/2022 0821 by Loel Ro, RN Outcome: Not Applicable 04/09/2022 0820 by Loel Ro, RN Outcome: Progressing   Problem: Elimination: Goal: Will not experience complications related to bowel motility 04/09/2022 0821 by Loel Ro, RN Outcome: Not Applicable 04/09/2022 0820 by Loel Ro, RN Outcome: Progressing Note: Bowel prep in process, scheduled for flex sig today Goal: Will not experience complications related to urinary retention 04/09/2022 0821 by Loel Ro, RN Outcome: Not Applicable 04/09/2022 0820 by Loel Ro, RN Outcome: Progressing   Problem: Pain Managment: Goal: General experience of comfort will improve Outcome: Not Applicable   Problem: Safety: Goal: Ability to remain free from injury will improve Outcome: Not Applicable   Problem: Skin Integrity: Goal: Risk for impaired skin integrity will decrease Outcome: Not Applicable   Problem: Education: Goal: Required Educational Video(s) Outcome: Not Applicable   Problem: Clinical Measurements: Goal: Ability to maintain clinical measurements within normal limits will improve Outcome: Not Applicable Goal: Postoperative complications will be avoided or minimized Outcome: Not Applicable   Problem: Skin Integrity: Goal: Demonstration of wound healing without infection will improve Outcome: Not Applicable

## 2022-04-10 DIAGNOSIS — K6289 Other specified diseases of anus and rectum: Secondary | ICD-10-CM | POA: Diagnosis not present

## 2022-04-10 LAB — CBC
HCT: 43.8 % (ref 36.0–46.0)
Hemoglobin: 13.9 g/dL (ref 12.0–15.0)
MCH: 31 pg (ref 26.0–34.0)
MCHC: 31.7 g/dL (ref 30.0–36.0)
MCV: 97.6 fL (ref 80.0–100.0)
Platelets: 166 10*3/uL (ref 150–400)
RBC: 4.49 MIL/uL (ref 3.87–5.11)
RDW: 15 % (ref 11.5–15.5)
WBC: 3.8 10*3/uL — ABNORMAL LOW (ref 4.0–10.5)
nRBC: 0 % (ref 0.0–0.2)

## 2022-04-10 LAB — SURGICAL PATHOLOGY

## 2022-04-10 MED ORDER — ALUM & MAG HYDROXIDE-SIMETH 200-200-20 MG/5ML PO SUSP: 30.0000 mL | Freq: Once | ORAL | Status: DC

## 2022-04-10 MED ORDER — POLYETHYLENE GLYCOL 3350 17 G PO PACK: 17.0000 g | PACK | Freq: Every day | ORAL | 0 refills | Status: DC

## 2022-04-10 NOTE — Evaluation (Signed)
Physical therapy Evaluation  Patient Details Name: Angela Leach MRN: [id number] DOB: Sep 25, 1933 Today's Date: 04/10/2022  History of Present Illness  Angela Leach is an 88 y.o. F with AF and sick sinus syndrome and MR, hx DVT now on Xarelto, who presented with 1 month intermittent abdominal pain, 53 lb weight loss, and fatigue/anorexia.  Clinical Impression  The patient is eager to ambulate. Patient started out with her cane but then moved to Rw for hall ambulation. Patient's sister present. Patient mod Independnet in her home, daughter lives with patient. No further PT needs.patient  to Dc home .        Recommendations for follow up therapy are one component of a multi-disciplinary discharge planning process, led by the attending physician.  Recommendations may be updated based on patient status, additional functional criteria and insurance authorization.  Follow Up Recommendations No PT follow up      Assistance Recommended at Discharge Set up Supervision/Assistance  Patient can return home with the following  A little help with bathing/dressing/bathroom;Help with stairs or ramp for entrance;Assistance with cooking/housework;Assist for transportation    Equipment Recommendations None recommended by PT  Recommendations for Other Services       Functional Status Assessment Patient has not had a recent decline in their functional status     Precautions / Restrictions Precautions Precautions: Fall      Mobility  Bed Mobility Overal bed mobility: Modified Independent                  Transfers Overall transfer level: Needs assistance Equipment used: Rolling walker (2 wheels) Transfers: Sit to/from Stand Sit to Stand: Supervision                Ambulation/Gait Ambulation/Gait assistance: Supervision Gait Distance (Feet): 100 Feet Assistive device: Rolling walker (2 wheels) Gait Pattern/deviations: Step-through pattern Gait velocity: decr     General  Gait Details: cues for safe turning with the RW. Patient will turn with 1 back post on the floor and spin around.  Stairs            Wheelchair Mobility    Modified Rankin (Stroke Patients Only)       Balance Overall balance assessment: Mild deficits observed, not formally tested, History of Falls                                           Pertinent Vitals/Pain Pain Assessment Pain Assessment: No/denies pain    Home Living Family/patient expects to be discharged to:: Private residence Living Arrangements: Children Available Help at Discharge: Family Type of Home: House Home Access: Stairs to enter Entrance Stairs-Rails: Left Entrance Stairs-Number of Steps: 3   Home Layout: Multi-level;Able to live on main level with bedroom/bathroom Home Equipment: Rolling Walker (2 wheels);Cane - single point;BSC/3in1      Prior Function Prior Level of Function : Independent/Modified Independent                     Hand Dominance   Dominant Hand: Right    Extremity/Trunk Assessment   Upper Extremity Assessment Upper Extremity Assessment: Overall WFL for tasks assessed    Lower Extremity Assessment Lower Extremity Assessment: Generalized weakness    Cervical / Trunk Assessment Cervical / Trunk Assessment: Kyphotic  Communication   Communication: No difficulties  Cognition Arousal/Alertness: Awake/alert Behavior During Therapy: WFL for tasks assessed/performed Overall  Cognitive Status: Within Functional Limits for tasks assessed                                          General Comments      Exercises     Assessment/Plan    PT Assessment Patient does not need any further PT services  PT Problem List         PT Treatment Interventions      PT Goals (Current goals can be found in the Care Plan section)  Acute Rehab PT Goals Patient Stated Goal: go home PT Goal Formulation: All assessment and education complete,  DC therapy    Frequency       Co-evaluation               AM-PAC PT "6 Clicks" Mobility  Outcome Measure Help needed turning from your back to your side while in a flat bed without using bedrails?: None Help needed moving from lying on your back to sitting on the side of a flat bed without using bedrails?: None Help needed moving to and from a bed to a chair (including a wheelchair)?: A Little Help needed standing up from a chair using your arms (e.g., wheelchair or bedside chair)?: A Little Help needed to walk in hospital room?: A Little Help needed climbing 3-5 steps with a railing? : A Little 6 Click Score: 20    End of Session Equipment Utilized During Treatment: Gait belt Activity Tolerance: Patient tolerated treatment well Patient left: in bed;with call bell/phone within reach;with family/visitor present Nurse Communication: Mobility status PT Visit Diagnosis: Difficulty in walking, not elsewhere classified (R26.2)    Time: 7741-2878 PT Time Calculation (min) (ACUTE ONLY): 23 min   Charges:   PT Evaluation $PT Eval Low Complexity: 1 Low PT Treatments $Gait Training: 8-22 mins        Blanchard Kelch PT Acute Rehabilitation Services Office [phone] Weekend pager-[phone]   Rada Hay 04/10/2022, 1:15 PM

## 2022-04-10 NOTE — Care Management Important Message (Signed)
Important Message  Patient Details  Name: Angela Leach MRN: [id number] Date of Birth: 09/05/1933   Medicare Important Message Given:  Yes     Mardene Sayer 04/10/2022, 12:28 PM

## 2022-04-10 NOTE — Progress Notes (Signed)
[facility] Progress Note  Angela Leach 88 y.o. 03-12-1933  CC: Proctitis   Subjective: Patient states she is feeling good today.  Denies pain.  Denies nausea/vomiting.  Would like to go home.  ROS : Review of Systems  Constitutional:  Negative for chills, fever and weight loss.  Gastrointestinal:  Negative for abdominal pain, blood in stool, constipation, diarrhea, heartburn, melena, nausea and vomiting.      Objective: Vital signs in last 24 hours: Vitals:   04/09/22 2324 04/10/22 0408  BP: 103/87 112/86  Pulse: (!) 120 62  Resp: (!) 22 18  Temp: 97.8 F (36.6 C) 97.6 F (36.4 C)  SpO2: 97% 95%    Physical Exam:  General:  Alert, cooperative, no distress, appears stated age  Head:  Normocephalic, without obvious abnormality, atraumatic  Eyes:  Anicteric sclera, EOM's intact  Lungs:   Clear to auscultation bilaterally, respirations unlabored  Heart:  Regular rate and rhythm, S1, S2 normal  Abdomen:   Soft, non-tender, bowel sounds active all four quadrants,  no masses,     Lab Results: Recent Labs    04/08/22 0536 04/09/22 0505  NA 140 144  K 3.3* 4.2  CL 108 109  CO2 25 27  GLUCOSE 99 96  BUN 11 8  CREATININE 0.62 0.63  CALCIUM 8.4* 8.6*   Recent Labs    04/08/22 0536  AST 19  ALT 14  ALKPHOS 49  BILITOT 0.7  PROT 5.9*  ALBUMIN 2.7*   Recent Labs    04/09/22 0505 04/10/22 0512  WBC 3.9* 3.8*  HGB 13.5 13.9  HCT 41.6 43.8  MCV 97.0 97.6  PLT 154 166   No results for input(s): "LABPROT", "INR" in the last 72 hours.    Assessment Abdominal pain, weight loss, abnormal CT - CT showed circumferential rectal wall thickening and edema involving the mid proximal sigmoid colon, suspicious for proctitis and colitis.   - hgb 13.4 - Normal LFTs - Normal kidney function - normal iron studies -Flex sigmoidoscopy 7/13: Hemorrhoids, hypertrophied anal papilla, stricture in the proximal sigmoid colon-biopsied, remainder of colon  normal.   Plan: Patient is doing well today, can advance diet to soft diet. Recommend being on MiraLAX chronically due to stricture.  We will follow-up on biopsies [facility] will sign off. Please contact us if we can be of any further assistance during this hospital stay.  Angela Leach [name] PA-C 04/10/2022, 10:25 AM  Contact #  [phone]

## 2022-04-10 NOTE — Discharge Summary (Signed)
Physician Discharge Summary   Patient: Angela Leach MRN: [id number] DOB: [birth year]  Admit date:     04/06/2022  Discharge date: 04/10/22  Discharge Physician: Alberteen Sam   PCP: No primary care provider on file.     Recommendations at discharge:  Follow up with GI Dr. Ewing Schlein for biopsy results     Discharge Diagnoses: Principal Problem:   Proctitis Active Problems:   Tachycardia-bradycardia syndrome (HCC)   PAF (paroxysmal atrial fibrillation) (HCC)   History of DVT (deep vein thrombosis)   Unintentional weight loss   Malnutrition of moderate degree      Hospital Course: Angela Leach is an 88 y.o. F with AF and sick sinus syndrome and MR, hx DVT now on Xarelto, who presented with 1 month intermittent abdominal pain, 53 lb weight loss, and fatigue/anorexia.  In the ER, CT showed circumferential rectal wall thickening and edema involving the mid proximal sigmoid colon, suspicious for proctitis and colitis versus malignancy.  She was started on antibiotics and admitted.   * Colon thickening CT shows thickening of the rectum and colon, proctitis or colitis vs malignancy vs stricture.  Give the clinical syndrome of >2 months symptoms and weight loss, infection was ruled out.    GI were consulted who performed flexible sigmoidoscopy that showed long stenosis in the sigmoid colon "characterized by spasm and edema" only.  Biopsies taken.    Patient tolerated a solid diet and was discharged with follow up with GI for biopsy results.    GI recommended to resume Xarelto within a few days.             The [facility] Controlled Substances Registry was reviewed for this patient prior to discharge.   Consultants: Gastroenterology, Dr. Ewing Schlein Procedures performed:  - Flexible sigmoidoscopy  Disposition: Home Diet recommendation:  Discharge Diet Orders (From admission, onward)     Start     Ordered   04/10/22 0000  Diet - low sodium heart healthy         04/10/22 1324             DISCHARGE MEDICATION: Allergies as of 04/10/2022       Reactions   Penicillins Hives, Rash   Did it involve swelling of the face/tongue/throat, SOB, or low BP? Y Did it involve sudden or severe rash/hives, skin peeling, or any reaction on the inside of your mouth or nose? Y Did you need to seek medical attention at a hospital or doctor's office? N When did it last happen?  Over 30 years   If all above answers are "NO", may proceed with cephalosporin use.        Medication List     STOP taking these medications    acetaminophen 500 MG tablet Commonly known as: TYLENOL   acetaminophen 650 MG CR tablet Commonly known as: TYLENOL   Multivitamin Adults Tabs   ondansetron 4 MG tablet Commonly known as: ZOFRAN   promethazine 12.5 MG tablet Commonly known as: PHENERGAN       TAKE these medications    CALCIUM-VITAMIN D PO Take 1 tablet by mouth at bedtime.   polyethylene glycol 17 g packet Commonly known as: MIRALAX / GLYCOLAX Take 17 g by mouth daily. Start taking on: April 11, 2022   Xarelto 20 MG Tabs tablet Generic drug: rivaroxaban Take 20 mg by mouth daily. Notes to patient: This Medication is to start on April 11, 2022.        Follow-up Information  Vida Rigger, MD Follow up.   Specialty: Gastroenterology Contact information: [address] [place] [address] [phone]         Clovis Riley, L.August Saucer, MD Follow up.   Specialty: Family Medicine Contact information: [address] [place] [address] [phone]                 Discharge Instructions     Diet - low sodium heart healthy   Complete by: As directed    Discharge instructions   Complete by: As directed    From Dr. Maryfrances Bunnell: You were admitted for abdominal pain.  You were found to have a thickening and narrowing of your colon. Follow all instructions of Dr. Ewing Schlein Follow up with Dr. Ewing Schlein as you were  instructed (go to his office if you have an appoitnment, and if you don't have the appointment yet, call his office to arrange)  Use MiraLAX once daily to soften stools  Resume your Xarelto TOMORROW (Sunday)   Increase activity slowly   Complete by: As directed        Discharge Exam: Filed Weights   04/06/22 1416 04/07/22 0000  Weight: 77.1 kg 79.2 kg    General: Pt is alert, awake, not in acute distress Cardiovascular: RRR, nl S1-S2, no murmurs appreciated.   No LE edema.   Respiratory: Normal respiratory rate and rhythm.  CTAB without rales or wheezes. Abdominal: Abdomen soft and non-tender.  No distension or HSM.   Neuro/Psych: Strength symmetric in upper and lower extremities.  Judgment and insight appear normal.   Condition at discharge: good  The results of significant diagnostics from this hospitalization (including imaging, microbiology, ancillary and laboratory) are listed below for reference.   Imaging Studies: DG Chest 2 View  Result Date: 04/06/2022 CLINICAL DATA:  Atrial fibrillation. EXAM: CHEST - 2 VIEW COMPARISON:  None Available. FINDINGS: Heart is mildly enlarged. Both lungs are clear. The visualized skeletal structures are unremarkable. IMPRESSION: No active cardiopulmonary disease. Mild cardiomegaly. Electronically Signed   By: Darliss Cheney M.D.   On: 04/06/2022 18:39   CT ABDOMEN PELVIS W CONTRAST  Result Date: 04/06/2022 CLINICAL DATA:  88 year old with abdominal pain for 2 months. Decreased p.o. intake. EXAM: CT ABDOMEN AND PELVIS WITH CONTRAST TECHNIQUE: Multidetector CT imaging of the abdomen and pelvis was performed using the standard protocol following bolus administration of intravenous contrast. RADIATION DOSE REDUCTION: This exam was performed according to the departmental dose-optimization program which includes automated exposure control, adjustment of the mA and/or kV according to patient size and/or use of iterative reconstruction technique.  CONTRAST:  OMNIPAQUE IOHEXOL 300 MG/ML  SOLN COMPARISON:  None Available. FINDINGS: Lower chest: Moderate small left pleural effusion with adjacent compressive atelectasis. Heart is normal in size. Hepatobiliary: Decreased hepatic density typical of steatosis. Subcentimeter enhancing focus in the central right lobe series 2, image 24, too small to characterize. Cholecystectomy. 10 mm common bile duct is normal post cholecystectomy. No visualized choledocholithiasis. Pancreas: Diffuse fatty atrophy. No ductal dilatation or inflammation. Spleen: Normal in size without focal abnormality. Adrenals/Urinary Tract: Normal adrenal glands. No hydronephrosis. No renal calculi or focal lesion. Partially distended urinary bladder, possible mild wall thickening at the dome. Stomach/Bowel: There is circumferential rectal wall thickening, series 2, image 71. There also areas of wall thickening involving the mid proximal sigmoid colon, series 2, image 66 with adjacent pericolonic edema. Small to moderate volume of stool in the remainder of the colon, there is colonic tortuosity. The  appendix is not definitively visualized. No small bowel obstruction or inflammatory change. Small hiatal hernia, otherwise unremarkable appearance of the stomach. Vascular/Lymphatic: Aortic atherosclerosis. No aortic aneurysm. No portal venous or mesenteric gas. No bulky abdominopelvic adenopathy. Reproductive: Mild quiescent uterus with slight periuterine stranding, like reactive due to adjacent colonic fat stranding. No adnexal mass. Other: Trace free fluid in the pelvis. Moderate presacral edema. 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 No abdominal wall hernia. Musculoskeletal: The bones are under mineralized. Faint sclerotic focus in the mid sacrum is likely a bone island. Mild scoliosis and diffuse degenerative change in the spine.  IMPRESSION: 1. Circumferential rectal wall thickening as well as area of wall thickening and edema involving the mid proximal sigmoid colon, suspicious for proctitis and colitis. Consider direct visualization with colonoscopy after course of treatment to exclude underlying colonic neoplasm. 2. Moderate small left pleural effusion with adjacent compressive atelectasis. 3. Hepatic steatosis. Subcentimeter enhancing focus in the central right lobe is too small to characterize. Given size, this cannot be further characterize, no dedicated follow-up recommended. 4. Small hiatal hernia. Aortic Atherosclerosis (ICD10-I70.0). Electronically Signed   By: Narda Rutherford M.D.   On: 04/06/2022 18:18    Microbiology: Results for orders placed or performed during the hospital encounter of 11/21/19  SARS CORONAVIRUS 2 (TAT 6-24 HRS) Nasopharyngeal Nasopharyngeal Swab     Status: None   Collection Time: 11/21/19  7:28 PM   Specimen: Nasopharyngeal Swab  Result Value Ref Range Status   SARS Coronavirus 2 NEGATIVE NEGATIVE Final    Comment: (NOTE) SARS-CoV-2 target nucleic acids are NOT DETECTED. The SARS-CoV-2 RNA is generally detectable in upper and lower respiratory specimens during the acute phase of infection. Negative results do not preclude SARS-CoV-2 infection, do not rule out co-infections with other pathogens, and should not be used as the sole basis for treatment or other patient management decisions. Negative results must be combined with clinical observations, patient history, and epidemiological information. The expected result is Negative. Fact Sheet for Patients: HairSlick.no Fact Sheet for Healthcare Providers: quierodirigir.com This test is not yet approved or cleared by the [place] FDA and  has been authorized for detection and/or diagnosis of SARS-CoV-2 by FDA under an Emergency Use Authorization (EUA). This EUA will remain  in  effect (meaning this test can be used) for the duration of the COVID-19 declaration under Section 56 4(b)(1) of the Act, 21 U.S.C. section 360bbb-3(b)(1), unless the authorization is terminated or revoked sooner. Performed at [facility], [address], [place], [place] [address]     Labs: CBC: Recent Labs  Lab 04/06/22 1431 04/08/22 0536 04/09/22 0505 04/10/22 0512  WBC 4.3 3.6* 3.9* 3.8*  HGB 14.1 13.4 13.5 13.9  HCT 42.6 40.6 41.6 43.8  MCV 94.5 95.5 97.0 97.6  PLT 182 153 154 166   Basic Metabolic Panel: Recent Labs  Lab 04/06/22 1528 04/08/22 0536 04/09/22 0505  NA 142 140 144  K 3.8 3.3* 4.2  CL 104 108 109  CO2 29 25 27   GLUCOSE 101* 99 96  BUN 11 11 8   CREATININE 0.65 0.62  CALCIUM 8.7* 8.4* 8.6*   Liver Function Tests: Recent Labs  Lab 04/06/22 1528 04/08/22 0536  AST 24 19  ALT 15 14  ALKPHOS 58 49  BILITOT 1.0 0.7  PROT 6.9 5.9*  ALBUMIN 3.1* 2.7*   CBG: No results for  input(s): "GLUCAP" in the last 168 hours.  Discharge time spent: approximately 25 minutes spent on discharge counseling, evaluation of patient on day of discharge, and coordination of discharge planning with nursing, social work, pharmacy and case management  Signed: Alberteen Sam, MD [facility] 04/10/2022

## 2022-04-11 NOTE — Anesthesia Postprocedure Evaluation (Signed)
Anesthesia Post Note  Patient: Angela Leach  Procedure(s) Performed: [facility] BIOPSY     Patient location during evaluation: Endoscopy Anesthesia Type: MAC Level of consciousness: oriented, awake and alert and awake Pain management: pain level controlled Vital Signs Assessment: post-procedure vital signs reviewed and stable Respiratory status: spontaneous breathing, nonlabored ventilation, respiratory function stable and patient connected to nasal cannula oxygen Cardiovascular status: blood pressure returned to baseline, stable and tachycardic Postop Assessment: no headache, no backache and no apparent nausea or vomiting Anesthetic complications: no   No notable events documented.  Last Vitals:  Vitals:   04/10/22 0408 04/10/22 1301  BP: 112/86 93/79  Pulse: 62 (!) 57  Resp: 18 20  Temp: 36.4 C 36.9 C  SpO2: 95% 94%    Last Pain:  Vitals:   04/10/22 1301  TempSrc: Oral  PainSc:                  Santa Lighter

## 2022-04-30 ENCOUNTER — Ambulatory Visit: Payer: Medicare HMO | Admitting: Family Medicine

## 2022-05-08 ENCOUNTER — Encounter: Payer: Self-pay | Admitting: Nurse Practitioner

## 2022-05-08 ENCOUNTER — Telehealth: Payer: Self-pay | Admitting: Nurse Practitioner

## 2022-05-08 ENCOUNTER — Ambulatory Visit (INDEPENDENT_AMBULATORY_CARE_PROVIDER_SITE_OTHER): Payer: Medicare HMO | Admitting: Nurse Practitioner

## 2022-05-08 ENCOUNTER — Ambulatory Visit: Payer: Medicare HMO | Admitting: Nurse Practitioner

## 2022-05-08 VITALS — BP 96/80 | HR 103 | Temp 97.3°F | Wt 181.6 lb

## 2022-05-08 DIAGNOSIS — I480 Paroxysmal atrial fibrillation: Secondary | ICD-10-CM | POA: Diagnosis not present

## 2022-05-08 DIAGNOSIS — Z86718 Personal history of other venous thrombosis and embolism: Secondary | ICD-10-CM

## 2022-05-08 DIAGNOSIS — Z1231 Encounter for screening mammogram for malignant neoplasm of breast: Secondary | ICD-10-CM | POA: Diagnosis not present

## 2022-05-08 MED ORDER — XARELTO 20 MG PO TABS
20.0000 mg | ORAL_TABLET | Freq: Every day | ORAL | 1 refills | Status: DC
Start: 2022-05-08 — End: 2022-05-13

## 2022-05-08 NOTE — Assessment & Plan Note (Addendum)
She has a history of paroxysmal A-fib.  Her heart rate is regular on exam today.  Her CHA2DS2-VASc score is a 3, will continue Xarelto 20 mg daily.  We will place referral to cardiology to get her established with a new provider.  Last BMP, CBC, TSH reviewed.  Requesting notes and labs from prior PCP.  Follow-up in 3 months.

## 2022-05-08 NOTE — Progress Notes (Signed)
New Patient Visit  BP 96/80   Pulse (!) 103   Temp (!) 97.3 F (36.3 C) (Temporal)   Wt 181 lb 9.6 oz (82.4 kg)   SpO2 94%   BMI 31.17 kg/m    Subjective:    Patient ID: Angela Leach, female    DOB: Mar 18, 1933, 88 y.o.   MRN: [id number]  CC: Chief Complaint  Patient presents with   Establish Care    Np. Est care. Overall health assessment. Discuss Afib dx    HPI: Angela Leach is a 88 y.o. female presents for new patient visit to establish care.  Introduced to Publishing rights manager role and practice setting.  All questions answered.  Discussed provider/patient relationship and expectations.  Niki has a history of A-fib with DVT.  She is currently taking Xarelto 20 mg daily.  She states that she has had 1 fall in the last 6 months which was partly what led them to find a DVT in her leg.  She states that since she has had this blood clot, she has been taking her blood thinner every day.  She denies bleeding, extra bruising, and chest pain.  She does endorse some shortness of breath with exertion.  She states that her prior cardiologist left the practice.  Depression and anxiety screen done:     05/08/2022   12:04 PM  Depression screen PHQ 2/9  Decreased Interest 3  Down, Depressed, Hopeless 2  PHQ - 2 Score 5  Altered sleeping 1  Tired, decreased energy 3  Change in appetite 2  Feeling bad or failure about yourself  0  Trouble concentrating 0  Moving slowly or fidgety/restless 0  PHQ-9 Score 11  Difficult doing work/chores Extremely dIfficult      05/08/2022   12:04 PM  GAD 7 : Generalized Anxiety Score  Nervous, Anxious, on Edge 2  Control/stop worrying 3  Worry too much - different things 3  Trouble relaxing 3  Restless 1  Easily annoyed or irritable 3  Afraid - awful might happen 1  Total GAD 7 Score 16  Anxiety Difficulty Extremely difficult    Past Medical History:  Diagnosis Date   Atrial fibrillation (HCC)    DVT (deep venous thrombosis) (HCC)     PVC (premature ventricular contraction) 01/11/2020   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    Past Surgical History:  Procedure Laterality Date   APPENDECTOMY     BIOPSY  04/09/2022   Procedure: BIOPSY;  Surgeon: Vida Rigger, MD;  Location: [facility];  Service: Gastroenterology;;   Franchot Erichsen SIGMOIDOSCOPY N/A 04/09/2022   Procedure: Arnell Sieving;  Surgeon: Vida Rigger, MD;  Location: [facility];  Service: Gastroenterology;  Laterality: N/A;    Family History  Problem Relation Age of Onset   Rheumatic fever Sister    Stroke Brother    Diabetes Maternal Grandmother    Cancer Paternal Grandmother        stomach     Social History   Tobacco Use   Smoking status: Never   Smokeless tobacco: Never  Vaping Use   Vaping Use: Never used  Substance Use Topics   Alcohol use: Never   Drug use: Never    Current Outpatient Medications on File Prior to Visit  Medication Sig Dispense Refill   COCONUT OIL PO Take by mouth.     CALCIUM-VITAMIN D PO Take 1 tablet by mouth at bedtime.     No current facility-administered medications on file  prior to visit.     Review of Systems  Constitutional:  Positive for fatigue.  HENT: Negative.    Respiratory:  Positive for shortness of breath (with exertion).   Cardiovascular:  Positive for palpitations (intermittent). Negative for chest pain.  Gastrointestinal: Negative.   Genitourinary: Negative.   Musculoskeletal: Negative.   Skin: Negative.   Neurological: Negative.   Psychiatric/Behavioral:  The patient is nervous/anxious.         Objective:    BP 96/80   Pulse (!) 103   Temp (!) 97.3 F (36.3 C) (Temporal)   Wt 181 lb 9.6 oz (82.4 kg)   SpO2 94%   BMI 31.17 kg/m   Wt Readings from Last 3 Encounters:  05/08/22 181 lb 9.6 oz (82.4 kg)  04/07/22 174 lb 9.7 oz (79.2 kg)  06/05/20 199 lb 3.2 oz (90.4 kg)    BP Readings from Last 3 Encounters:  05/08/22 96/80  04/10/22 93/79   06/05/20 122/73    Physical Exam Vitals and nursing note reviewed.  Constitutional:      General: She is not in acute distress.    Appearance: Normal appearance.  HENT:     Head: Normocephalic.  Eyes:     Conjunctiva/sclera: Conjunctivae normal.  Cardiovascular:     Rate and Rhythm: Normal rate and regular rhythm.     Pulses: Normal pulses.     Heart sounds: Normal heart sounds.  Pulmonary:     Effort: Pulmonary effort is normal.     Breath sounds: Normal breath sounds.  Abdominal:     Palpations: Abdomen is soft.     Tenderness: There is no abdominal tenderness.  Musculoskeletal:     Cervical back: Normal range of motion and neck supple. No tenderness.     Right lower leg: No edema.     Left lower leg: No edema.  Lymphadenopathy:     Cervical: No cervical adenopathy.  Skin:    General: Skin is warm.  Neurological:     General: No focal deficit present.     Mental Status: She is alert and oriented to person, place, and time.  Psychiatric:        Mood and Affect: Mood normal.        Behavior: Behavior normal.        Thought Content: Thought content normal.        Judgment: Judgment normal.        Assessment & Plan:   Problem List Items Addressed This Visit       Cardiovascular and Mediastinum   PAF (paroxysmal atrial fibrillation) (HCC) - Primary    She has a history of paroxysmal A-fib.  Her heart rate is regular on exam today.  Her CHA2DS2-VASc score is a 3, will continue Xarelto 20 mg daily.  We will place referral to cardiology to get her established with a new provider.  Last BMP, CBC, TSH reviewed.  Requesting notes and labs from prior PCP.  Follow-up in 3 months.      Relevant Medications   XARELTO 20 MG TABS tablet   Other Relevant Orders   Ambulatory referral to Cardiology     Other   History of DVT (deep vein thrombosis)    She has a history of a DVT, she states that since then she has been taking her Xarelto regularly.  Continue Xarelto 20 mg  daily.  Refill sent to the pharmacy.      Relevant Orders   Ambulatory referral to Cardiology  Other Visit Diagnoses     Encounter for screening mammogram for malignant neoplasm of breast       Mammogram ordered today   Relevant Orders   MM 3D SCREEN BREAST BILATERAL        Follow up plan: Return in about 3 months (around 08/08/2022) for a fib.

## 2022-05-08 NOTE — Telephone Encounter (Signed)
Pt called and would like you to give her a call back about her medication

## 2022-05-08 NOTE — Patient Instructions (Signed)
It was great to see you!  I have refilled your xarelto to the pharmacy.   I have placed an order for screening mammogram, they should call you to schedule. If you do not hear from them in the next week, please call:  [facility] [address] [place], [address] [place], [place] [phone]   I have placed a referral to cardiology.   Let's follow-up in 3 months, sooner if you have concerns.  If a referral was placed today, you will be contacted for an appointment. Please note that routine referrals can sometimes take up to 3-4 weeks to process. Please call our office if you haven't heard anything after this time frame.  Take care,  Rodman Pickle, NP

## 2022-05-08 NOTE — Assessment & Plan Note (Signed)
She has a history of a DVT, she states that since then she has been taking her Xarelto regularly.  Continue Xarelto 20 mg daily.  Refill sent to the pharmacy.

## 2022-05-08 NOTE — Telephone Encounter (Signed)
Pt called and said she left all her paper work here in the bathroom here and wanted can you mail them to her

## 2022-05-13 ENCOUNTER — Other Ambulatory Visit: Payer: Self-pay | Admitting: Nurse Practitioner

## 2022-05-13 ENCOUNTER — Telehealth: Payer: Self-pay | Admitting: Nurse Practitioner

## 2022-05-13 MED ORDER — APIXABAN 5 MG PO TABS: 5.0000 mg | ORAL_TABLET | Freq: Two times a day (BID) | ORAL | 1 refills | Status: DC

## 2022-05-13 NOTE — Telephone Encounter (Signed)
Caller Name: Chandni Call back phone #: [phone]  MEDICATION(S): XARELTO 20 MG TABS tablet  Days of Med Remaining:   Has the patient contacted their pharmacy (YES/NO)?  What did pharmacy advise?  Preferred Pharmacy:  [facility] - [place], [place] - [address] Phone:  [phone]      ~~~Please advise patient/caregiver to allow 2-3 business days to process RX refills.   Pt called the pharmacy  and the pharmacy stated that the medication will be 45.00 for a 30 day supply . Pt want to know if you can prescribe a cheaper medication.

## 2022-05-13 NOTE — Telephone Encounter (Signed)
Called and ldvm. Pcp recommended pt can change to taking eliquis 5 mg 2x day if she cannot afford Xarelto. Called [facility] and medication will be $45 as well. Sw,cma Advised to call back directly if there are further questions.

## 2022-06-09 ENCOUNTER — Encounter (HOSPITAL_COMMUNITY): Payer: Self-pay | Admitting: Emergency Medicine

## 2022-06-09 ENCOUNTER — Ambulatory Visit: Payer: Medicare HMO | Attending: Internal Medicine | Admitting: Internal Medicine

## 2022-06-09 ENCOUNTER — Encounter: Payer: Self-pay | Admitting: Internal Medicine

## 2022-06-09 ENCOUNTER — Emergency Department (HOSPITAL_COMMUNITY)
Admission: EM | Admit: 2022-06-09 | Discharge: 2022-06-09 | Disposition: A | Discharge status: Home / Self Care | Payer: Medicare HMO | Attending: Emergency Medicine | Admitting: Emergency Medicine

## 2022-06-09 ENCOUNTER — Emergency Department (HOSPITAL_COMMUNITY): Payer: Medicare HMO

## 2022-06-09 VITALS — BP 110/64 | HR 144 | Ht 64.0 in | Wt 172.6 lb

## 2022-06-09 DIAGNOSIS — I4891 Unspecified atrial fibrillation: Secondary | ICD-10-CM

## 2022-06-09 DIAGNOSIS — I480 Paroxysmal atrial fibrillation: Secondary | ICD-10-CM | POA: Diagnosis not present

## 2022-06-09 DIAGNOSIS — J90 Pleural effusion, not elsewhere classified: Secondary | ICD-10-CM | POA: Diagnosis not present

## 2022-06-09 DIAGNOSIS — R0602 Shortness of breath: Secondary | ICD-10-CM | POA: Diagnosis present

## 2022-06-09 LAB — CBC
HCT: 48.2 % — ABNORMAL HIGH (ref 36.0–46.0)
Hemoglobin: 15.6 g/dL — ABNORMAL HIGH (ref 12.0–15.0)
MCH: 31.6 pg (ref 26.0–34.0)
MCHC: 32.4 g/dL (ref 30.0–36.0)
MCV: 97.8 fL (ref 80.0–100.0)
Platelets: 118 10*3/uL — ABNORMAL LOW (ref 150–400)
RBC: 4.93 MIL/uL (ref 3.87–5.11)
RDW: 15.4 % (ref 11.5–15.5)
WBC: 4.1 10*3/uL (ref 4.0–10.5)
nRBC: 0 % (ref 0.0–0.2)

## 2022-06-09 LAB — BASIC METABOLIC PANEL
Anion gap: 11 (ref 5–15)
BUN: 8 mg/dL (ref 8–23)
CO2: 23 mmol/L (ref 22–32)
Calcium: 9.3 mg/dL (ref 8.9–10.3)
Chloride: 108 mmol/L (ref 98–111)
Creatinine, Ser: 0.9 mg/dL (ref 0.44–1.00)
GFR, Estimated: >60 mL/min (ref >60)
Glucose, Bld: 81 mg/dL (ref 70–99)
Potassium: 4 mmol/L (ref 3.5–5.1)
Sodium: 142 mmol/L (ref 135–145)

## 2022-06-09 LAB — MAGNESIUM: Magnesium: 1.8 mg/dL (ref 1.7–2.4)

## 2022-06-09 MED ORDER — ETOMIDATE 2 MG/ML IV SOLN
0.3000 mg/kg | Freq: Once | INTRAVENOUS | Status: DC
  Filled 2022-06-09: qty 20

## 2022-06-09 MED ORDER — METOPROLOL TARTRATE 25 MG PO TABS: 12.5000 mg | ORAL_TABLET | Freq: Two times a day (BID) | ORAL | 3 refills | Status: DC

## 2022-06-09 MED ORDER — ONDANSETRON HCL 4 MG/2ML IJ SOLN
4.0000 mg | Freq: Once | INTRAMUSCULAR | Status: AC
  Administered 2022-06-09: 4 mg via INTRAVENOUS
  Filled 2022-06-09: qty 2

## 2022-06-09 MED ORDER — LACTATED RINGERS IV BOLUS
1000.0000 mL | Freq: Once | INTRAVENOUS | Status: AC
  Administered 2022-06-09: 1000 mL via INTRAVENOUS

## 2022-06-09 MED ORDER — ETOMIDATE 2 MG/ML IV SOLN
INTRAVENOUS | Status: AC | PRN
  Administered 2022-06-09: 8 mg via INTRAVENOUS

## 2022-06-09 NOTE — Patient Instructions (Addendum)
Medication Instructions:  START:METOPROLOL TARTRATE 12.5mg  TWICE DAILY  *If you need a refill on your cardiac medications before your next appointment, please call your pharmacy*  Lab Work: None Ordered At This Time.  If you have labs (blood work) drawn today and your tests are completely normal, you will receive your results only by: MyChart Message (if you have MyChart) OR A paper copy in the mail If you have any lab test that is abnormal or we need to change your treatment, we will call you to review the results.  Testing/Procedures: Your physician has requested that you have an echocardiogram. Echocardiography is a painless test that uses sound waves to create images of your heart. It provides your doctor with information about the size and shape of your heart and how well your heart's chambers and valves are working. You may receive an ultrasound enhancing agent through an IV if needed to better visualize your heart during the echo.This procedure takes approximately one hour. There are no restrictions for this procedure. This will take place at the [address].   Follow-Up: At [facility], you and your health needs are our priority.  As part of our continuing mission to provide you with exceptional heart care, we have created designated Provider Care Teams.  These Care Teams include your primary Cardiologist (physician) and Advanced Practice Providers (APPs -  Physician Assistants and Nurse Practitioners) who all work together to provide you with the care you need, when you need it.  We recommend signing up for the patient portal called "MyChart".  Sign up information is provided on this After Visit Summary.  MyChart is used to connect with patients for Virtual Visits (Telemedicine).  Patients are able to view lab/test results, encounter notes, upcoming appointments, etc.  Non-urgent messages can be sent to your provider as well.   To learn more about what you can do  with MyChart, go to [email or web address].    Your next appointment:   1 month(s)  The format for your next appointment:   In Person  Provider:   Maisie Fus, MD    PLEASE REPORT TO [place] [facility] RO[facility]

## 2022-06-09 NOTE — Progress Notes (Signed)
Cardiology Office Note:    Date:  06/09/2022   ID:  Angela Leach, DOB 1932-12-06, MRN [id number]  PCP:  Gerre Scull, NP   [facility] Providers Cardiologist:  Maisie Fus, MD     Referring MD: Gerre Scull, NP   No chief complaint on file. Afib with RVR  History of Present Illness:    Angela Leach is a 88 y.o. female with a hx of  paroxysmal  atrial fibrillation diagnosed after a mechanical fall, EF normal 2021. No significant valve dx. She was on eliquis. Ziopatch was done showing 12% burden PVCs, 6% burden of afib at that time. HR down to 53 and plan was to avoid AVN blocking agents at that time. She self Dc'd eliquis due to misinformation. She is on xarelto and has not missed any doses. No consistent FU since. Today, she comes in to establish care and is in afib with RVR with Hrs 140s. She denies palpitations. She can gets some SOB at night. No PND. No LE edema. She has DOE with 20-30 feet. No significant weight gain.  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   metoprolol tartrate (LOPRESSOR) 25 MG tablet Take 0.5 tablets (12.5 mg total) by mouth 2 (two) times daily.    XARELTO 20 MG TABS tablet Take 20 mg by mouth daily.     Allergies:   Penicillins   Social History   Socioeconomic History   Marital status: Widowed    Spouse name: Not on file   Number of children: 2   Years of education: Not on file   Highest education level: Not on file  Occupational History   Not on file  Tobacco Use   Smoking status: Never   Smokeless tobacco: Never  Vaping Use   Vaping Use: Never used  Substance and Sexual Activity   Alcohol use: Never   Drug use: Never   Sexual activity: Not Currently  Other Topics Concern   Not on file  Social History Narrative   Not on file   Social Determinants of Health   Financial Resource Strain: Not on file  Food Insecurity: Not on file  Transportation Needs: Not on file  Physical Activity: Not on file  Stress: Not on file  Social Connections: Not on file     Family History: The patient's family history includes Cancer in her paternal grandmother; Diabetes in her maternal grandmother; Rheumatic fever in her sister; Stroke in her brother.  ROS:   Please see the history of present illness.     All other systems reviewed and are negative.  EKGs/Labs/Other Studies Reviewed:  The following studies were reviewed today:   EKG:  EKG is  ordered today.  The ekg ordered today demonstrates   06/09/2022- atrial fibrillation RVR 144  Recent Labs: 04/08/2022: ALT 14 04/09/2022: BUN 8; Creatinine, Ser 0.63; Potassium 4.2; Sodium 144; TSH 3.266 04/10/2022: Hemoglobin 13.9; Platelets 166   Recent Lipid Panel    Component Value Date/Time   CHOL 174 06/05/2020 1436   TRIG 72 06/05/2020 1436   HDL 68 06/05/2020 1436   CHOLHDL 2.6 06/05/2020 1436   LDLCALC 92 06/05/2020 1436     Risk Assessment/Calculations:    CHA2DS2-VASc Score = 3   This indicates a 3.2% annual risk of stroke. The patient's score is based upon: CHF History: 0 HTN History: 0 Diabetes History: 0 Stroke History: 0 Vascular Disease History: 0 Age  Score: 2 Gender Score: 1               Physical Exam:    VS:   Vitals:   06/09/22 1104  BP: 110/64  Pulse: (!) 144  SpO2: 95%      BP 110/64   Pulse (!) 144   Ht 5\' 4"  (1.626 m)   Wt 172 lb 9.6 oz (78.3 kg)   SpO2 95%   BMI 29.63 kg/m     Wt Readings from Last 3 Encounters:  06/09/22 172 lb 9.6 oz (78.3 kg)  05/08/22 181 lb 9.6 oz (82.4 kg)  04/07/22 174 lb 9.7 oz (79.2 kg)     GEN:  Well nourished, well developed in no acute distress HEENT: Normal NECK: No JVD; No carotid bruits LYMPHATICS: No lymphadenopathy CARDIAC: RRR, no murmurs, rubs, gallops RESPIRATORY:  Clear to auscultation without rales, wheezing or rhonchi  ABDOMEN: Soft, non-tender, non-distended MUSCULOSKELETAL:  No edema; No deformity  SKIN: Warm and dry NEUROLOGIC:  Alert and oriented x 3 PSYCHIATRIC:  Normal affect   ASSESSMENT:    Paroxysmal Atrial Fibrillation: She's in RVR today and has limited DOE. Appears euvolemic. I recommended that she go to the ED for DCCV. She's taken her xarelto faithfully. She had HR low 50s a few years ago, will start low dose BB and she how she does. If she is limited with AVN blockade, will refer her to EP for consideration of PPM for tachy-brady. - start metop 12.5 mg tartrate BID - contine xarelto 20 mg daily PLAN:    In order of problems listed above:  ED afib with RVR Start metoprolol tartrate 12.5 mg BID TTE Continue Xarelto 20 mg daily Follow up in one month      Medication Adjustments/Labs and Tests Ordered: Current medicines are reviewed at length with the patient today.  Concerns regarding medicines are outlined above.  Orders Placed This Encounter  Procedures   EKG 12-Lead   ECHOCARDIOGRAM COMPLETE   Meds ordered this encounter  Medications   metoprolol tartrate (LOPRESSOR) 25 MG tablet    Sig: Take 0.5 tablets (12.5 mg total) by mouth 2 (two) times daily.    Dispense:  30 tablet    Refill:  3    Patient Instructions  Medication  Instructions:  START:METOPROLOL TARTRATE 12.5mg  TWICE DAILY  *If you need a refill on your cardiac medications before your next appointment, please call your pharmacy*  Lab Work: None Ordered At This Time.  If you have labs (blood work) drawn today and your tests are completely normal, you will receive your results only by: MyChart Message (if you have MyChart) OR A paper copy in the mail If  you have any lab test that is abnormal or we need to change your treatment, we will call you to review the results.  Testing/Procedures: Your physician has requested that you have an echocardiogram. Echocardiography is a painless test that uses sound waves to create images of your heart. It provides your doctor with information about the size and shape of your heart and how well your heart's chambers and valves are working. You may receive an ultrasound enhancing agent through an IV if needed to better visualize your heart during the echo.This procedure takes approximately one hour. There are no restrictions for this procedure. This will take place at the [address].   Follow-Up: At [facility], you and your health needs are our priority.  As part of our continuing mission to provide you with exceptional heart care, we have created designated Provider Care Teams.  These Care Teams include your primary Cardiologist (physician) and Advanced Practice Providers (APPs -  Physician Assistants and Nurse Practitioners) who all work together to provide you with the care you need, when you need it.  We recommend signing up for the patient portal called "MyChart".  Sign up information is provided on this After Visit Summary.  MyChart is used to connect with patients for Virtual Visits (Telemedicine).  Patients are able to view lab/test results, encounter notes, upcoming appointments, etc.  Non-urgent messages can be sent to your provider as well.   To learn more about what you can do with  MyChart, go to [email or web address].    Your next appointment:   1 month(s)  The format for your next appointment:   In Person  Provider:   Maisie Fus, MD    PLEASE REPORT TO [place] [facility] ROOM         Signed, Maisie Fus, MD  06/09/2022 1:55 PM    [facility]

## 2022-06-09 NOTE — ED Provider Notes (Signed)
[facility] EMERGENCY DEPARTMENT Provider Note   CSN: [id number] Arrival date & time: 06/09/22  1242     History Chief Complaint  Patient presents with   Atrial Fibrillation    HPI Angela Leach is a 88 y.o. female presenting for A-fib with RVR.  Patient is sent over from clinic.  She has had vague shortness of breath and fatigue.  She denies fevers or chills nausea vomiting syncope shortness of breath.  She otherwise ambulatory tolerating p.o. intake.  She has been feeling palpitation for the last month.   Patient's recorded medical, surgical, social, medication list and allergies were reviewed in the Snapshot window as part of the initial history.   Review of Systems   Review of Systems  Constitutional:  Negative for chills and fever.  HENT:  Negative for ear pain and sore throat.   Eyes:  Negative for pain and visual disturbance.  Respiratory:  Negative for cough and shortness of breath.   Cardiovascular:  Positive for chest pain and palpitations.  Gastrointestinal:  Negative for abdominal pain and vomiting.  Genitourinary:  Negative for dysuria and hematuria.  Musculoskeletal:  Negative for arthralgias and back pain.  Skin:  Negative for color change and Leach.  Neurological:  Negative for seizures and syncope.  All other systems reviewed and are negative.   Physical Exam Updated Vital Signs BP 93/82   Pulse 70   Temp 97.6 F (36.4 C) (Oral)   Resp 14   SpO2 95%  Physical Exam Vitals and nursing note reviewed.  Constitutional:      General: She is not in acute distress.    Appearance: She is well-developed.  HENT:     Head: Normocephalic and atraumatic.  Eyes:     Conjunctiva/sclera: Conjunctivae normal.  Cardiovascular:     Rate and Rhythm: Normal rate and regular rhythm.     Heart sounds: No murmur heard. Pulmonary:     Effort: Pulmonary effort is normal. No respiratory distress.     Breath sounds: Normal breath sounds.  Abdominal:      Palpations: Abdomen is soft.     Tenderness: There is no abdominal tenderness.  Musculoskeletal:        General: No swelling.     Cervical back: Neck supple.  Skin:    General: Skin is warm and dry.     Capillary Refill: Capillary refill takes less than 2 seconds.  Neurological:     Mental Status: She is alert.  Psychiatric:        Mood and Affect: Mood normal.      ED Course/ Medical Decision Making/ A&P    Procedures .Critical Care  Performed by: Tretha Sciara, MD Authorized by: Tretha Sciara, MD   Critical care provider statement:    Critical care time (minutes):  30   Critical care was necessary to treat or prevent imminent or life-threatening deterioration of the following conditions:  Circulatory failure and cardiac failure   Critical care was time spent personally by me on the following activities:  Development of treatment plan with patient or surrogate, discussions with consultants, evaluation of patient's response to treatment, examination of patient, ordering and review of laboratory studies, ordering and review of radiographic studies, ordering and performing treatments and interventions, pulse oximetry, re-evaluation of patient's condition and review of old charts .Cardioversion  Date/Time: 06/09/2022 3:25 PM  Performed by: Tretha Sciara, MD Authorized by: Tretha Sciara, MD   Consent:    Consent obtained:  Verbal  Consent given by:  Patient   Risks discussed:  Cutaneous burn, death, induced arrhythmia and pain   Alternatives discussed:  No treatment, rate-control medication and alternative treatment Pre-procedure details:    Cardioversion basis:  Elective   Rhythm:  Atrial fibrillation   Electrode placement:  Anterior-lateral Patient sedated: Yes. Refer to sedation procedure documentation for details of sedation.  Attempt one:    Cardioversion mode:  Synchronous   Waveform:  Monophasic   Shock (Joules):  120   Shock outcome:  Conversion  to normal sinus rhythm Post-procedure details:    Patient status:  Awake .Sedation  Date/Time: 06/09/2022 3:26 PM  Performed by: Glyn Ade, MD Authorized by: Glyn Ade, MD   Consent:    Consent obtained:  Verbal and written   Consent given by:  Patient Universal protocol:    Immediately prior to procedure, a time out was called: yes   Pre-sedation assessment:    Time since last food or drink:  12 hours   ASA classification: class 1 - normal, healthy patient     Mallampati score:  I - soft palate, uvula, fauces, pillars visible   Pre-sedation assessments completed and reviewed: airway patency, cardiovascular function, hydration status, mental status, nausea/vomiting, pain level, respiratory function and temperature     Pre-sedation assessment completed:  06/09/2022 2:50 PM Immediate pre-procedure details:    Reassessment: Patient reassessed immediately prior to procedure     Reviewed: vital signs     Verified: emergency equipment available   Procedure details (see MAR for exact dosages):    Preoxygenation:  Nasal cannula   Sedation:  Etomidate   Intended level of sedation: deep   Total Provider sedation time (minutes):  23 Post-procedure details:    Post-sedation assessment completed:  06/09/2022 3:27 PM   Attendance: Constant attendance by certified staff until patient recovered     Recovery: Patient returned to pre-procedure baseline     Post-sedation assessments completed and reviewed: airway patency and cardiovascular function     Patient is stable for discharge or admission: no     Procedure completion:  Tolerated well, no immediate complications    Medications Ordered in ED Medications  etomidate (AMIDATE) injection 23.5 mg (has no administration in time range)  lactated ringers bolus 1,000 mL (1,000 mLs Intravenous New Bag/Given 06/09/22 1502)  ondansetron (ZOFRAN) injection 4 mg (4 mg Intravenous Given 06/09/22 1454)  etomidate (AMIDATE) injection (8 mg  Intravenous Given 06/09/22 1508)    Medical Decision Making:    Angela Leach is a 88 y.o. female who presented to the ED today with palpitations and diagnosed A-fib RVR sent in from cardiology clinic for cardioversion detailed above.     Patient's presentation is complicated by their history of comorbid problems, advanced age.  Patient placed on continuous vitals and telemetry monitoring while in ED which was reviewed periodically.   Complete initial physical exam performed, notably the patient  was notably stable in no acute distress.      Reviewed and confirmed nursing documentation for past medical history, family history, social history.    Initial Assessment:   Patient's history of present illness and physical exam findings are most consistent with A-fib with RVR.  Identified on EKG today.  Patient endorses consistency with her Xarelto use.  Patient underwent cardioversion as above with normal sinus rhythm appreciated.  Patient returned to her mental status baseline after the procedure and is stable for outpatient follow-up with her cardiologist. Patient denies fevers or chills nausea vomiting  syncope shortness of breath and feels comfortable with discharge.  She has already been arranged for follow-up with cardiologist in the outpatient setting.  Patient discharged with no further acute events. Clinical Impression:  1. Atrial fibrillation with RVR (HCC)      Data Unavailable   Final Clinical Impression(s) / ED Diagnoses Final diagnoses:  Atrial fibrillation with RVR [facility])    Rx / DC Orders ED Discharge Orders          Ordered    Amb referral to AFIB Clinic        06/09/22 1333              Glyn Ade, MD 06/09/22 1540

## 2022-06-09 NOTE — Progress Notes (Signed)
RT at bedside for a conscious sedation procedure.  Airway cart, suction, and ambu bag on standby.

## 2022-06-09 NOTE — ED Triage Notes (Addendum)
Patient sent to ED from cardiologist for further evaluation atrial fibrillation with RVR. Patient denies chest pain, denies new shortness of breath at rest but reports getting short of breath more easily with exertion. Patient is alert, oriented, and in no apparent distress at this time. Patient is on xarelto but states she has not taken today's dose.

## 2022-06-09 NOTE — ED Provider Triage Note (Signed)
Emergency Medicine Provider Triage Evaluation Note  Angela Leach , a 88 y.o. female  was evaluated in triage.  Pt complains of shortness of breath with exertion.  Patient reports history of A-fib, on Xarelto and reports compliance with medication except she did not take it today, last dose was yesterday.  Patient went to the cardiologist today for a regularly scheduled visit, was found to have elevated rate with her A-fib and was sent to the emergency room.  She denies chest pain, changes in her baseline lower extremity swelling.  Review of Systems  Positive: DOE Negative: Chest pain  Physical Exam  BP (!) 142/121 (BP Location: Left Arm)   Pulse 87   Temp 98.3 F (36.8 C) (Oral)   Resp 18   SpO2 94%  Gen:   Awake, no distress   Resp:  Normal effort  MSK:   Moves extremities without difficulty  Other:    Medical Decision Making  Medically screening exam initiated at 1:26 PM.  Appropriate orders placed.  Angela Leach was informed that the remainder of the evaluation will be completed by another provider, this initial triage assessment does not replace that evaluation, and the importance of remaining in the ED until their evaluation is complete.     Jeannie Fend, PA-C 06/09/22 1332

## 2022-06-10 ENCOUNTER — Ambulatory Visit: Payer: Medicare HMO

## 2022-06-16 ENCOUNTER — Telehealth: Payer: Self-pay

## 2022-06-16 NOTE — Telephone Encounter (Signed)
        Patient  visited [facility] on 06/09/2022   Telephone encounter attempt :  1st  A HIPAA compliant voice message was left requesting a return call.  Instructed patient to call back     [place], [place] [facility]  [phone] [address], [place],  [address] Phone: [phone] Email: [email or web address]

## 2022-06-17 ENCOUNTER — Ambulatory Visit
Admission: RE | Admit: 2022-06-17 | Discharge: 2022-06-17 | Disposition: A | Discharge status: Home / Self Care | Payer: Medicare HMO | Source: Ambulatory Visit | Attending: Nurse Practitioner | Admitting: Nurse Practitioner

## 2022-06-17 DIAGNOSIS — Z1231 Encounter for screening mammogram for malignant neoplasm of breast: Secondary | ICD-10-CM

## 2022-06-19 NOTE — Progress Notes (Signed)
Called and informed patient of results and provider instructions. Patient voiced understanding. Sw, cma

## 2022-07-01 ENCOUNTER — Encounter: Payer: Self-pay | Admitting: Internal Medicine

## 2022-07-01 ENCOUNTER — Ambulatory Visit (HOSPITAL_COMMUNITY): Payer: Medicare HMO | Attending: Internal Medicine

## 2022-07-01 ENCOUNTER — Ambulatory Visit (INDEPENDENT_AMBULATORY_CARE_PROVIDER_SITE_OTHER): Payer: Medicare HMO | Admitting: Internal Medicine

## 2022-07-01 VITALS — BP 112/70 | HR 62 | Ht 64.0 in

## 2022-07-01 DIAGNOSIS — I480 Paroxysmal atrial fibrillation: Secondary | ICD-10-CM | POA: Diagnosis not present

## 2022-07-01 DIAGNOSIS — I4891 Unspecified atrial fibrillation: Secondary | ICD-10-CM | POA: Diagnosis not present

## 2022-07-01 MED ORDER — DIGOXIN 125 MCG PO TABS: 0.1250 mg | ORAL_TABLET | Freq: Every day | ORAL | 3 refills | Status: AC

## 2022-07-01 MED ORDER — METOPROLOL SUCCINATE ER 25 MG PO TB24: 25.0000 mg | ORAL_TABLET | Freq: Every day | ORAL | 3 refills | Status: AC

## 2022-07-01 MED ORDER — POTASSIUM CHLORIDE ER 10 MEQ PO TBCR: 10.0000 meq | EXTENDED_RELEASE_TABLET | Freq: Every day | ORAL | 3 refills | Status: AC

## 2022-07-01 MED ORDER — FUROSEMIDE 20 MG PO TABS: 20.0000 mg | ORAL_TABLET | Freq: Every day | ORAL | 3 refills | Status: AC

## 2022-07-01 MED ORDER — DIGOXIN 250 MCG PO TABS: ORAL_TABLET | ORAL | 0 refills | Status: AC

## 2022-07-01 NOTE — Patient Instructions (Addendum)
Medication Instructions:  Your physician has recommended you make the following change in your medication:   ** Stop Metoprolol Tartrate  ** Begin Metoprolol Succinate 25mg  - 1 tablet by mouth daily  ** Begin Digoxin 0.25mg  - 1 tablet by mouth x 3 days then stop then  ** Begin Digoxin 0.125mg  - 1 tablet by mouth daily   ** Begin Furosemide 20mg  - 1 tablet by mouth daily  ** Begin Potassium 4meq 1 tablet by mouth daily  *If you need a refill on your cardiac medications before your next appointment, please call your pharmacy*   Lab Work: None ordered.  If you have labs (blood work) drawn today and your tests are completely normal, you will receive your results only by: [place] (if you have MyChart) OR A paper copy in the mail If you have any lab test that is abnormal or we need to change your treatment, we will call you to review the results.   Testing/Procedures: None ordered.    Follow-Up: At [facility], you and your health needs are our priority.  As part of our continuing mission to provide you with exceptional heart care, we have created designated Provider Care Teams.  These Care Teams include your primary Cardiologist (physician) and Advanced Practice Providers (APPs -  Physician Assistants and Nurse Practitioners) who all work together to provide you with the care you need, when you need it.  We recommend signing up for the patient portal called "MyChart".  Sign up information is provided on this After Visit Summary.  MyChart is used to connect with patients for Virtual Visits (Telemedicine).  Patients are able to view lab/test results, encounter notes, upcoming appointments, etc.  Non-urgent messages can be sent to your provider as well.   To learn more about what you can do with MyChart, go to [email or web address].    Your next appointment:   Keep appointment scheduled with Dr Phineas Inches on 07/09/2022 Important Information About [facility]

## 2022-07-01 NOTE — Progress Notes (Signed)
Patient Care Team: Charyl Dancer, NP as PCP - General (Internal Medicine) Janina Mayo, MD as PCP - Cardiology (Cardiology)   HPI  Angela Leach is a 88 y.o. female seen as an add-on for the echo lab following presentation for an echo ordered by Dr. Beckie Busing in the setting of atrial fibrillation with a rapid rate for which she underwent ER cardioversion a few weeks ago.  She was noted in the echo lab to have reverted to atrial fibrillation her LVEF was notably down.  She has had some palpitations with this.  She has not noted them of late.  She has had chronic dyspnea dating back to a fall 2 years ago which is not appreciably different.  She denies peripheral edema.  She has had some nocturnal dyspnea and some PND.  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   metoprolol tartrate (LOPRESSOR) 25 MG tablet Take 0.5 tablets (12.5 mg total) by mouth 2 (two) times daily.   XARELTO 20 MG TABS  tablet Take 20 mg by mouth daily.    Allergies  Allergen Reactions   Penicillins Hives and Rash    Did it involve swelling of the face/tongue/throat, SOB, or low BP? Y Did it involve sudden or severe rash/hives, skin peeling, or any reaction on the inside of your mouth or nose? Y Did you need to seek medical attention at a hospital or doctor's office? N When did it last happen?  Over 30 years   If all above answers are "NO", may proceed with cephalosporin use.       Review of Systems negative except from HPI and PMH  Physical Exam BP 112/70   Pulse 62   Ht 5\' 4"  (1.626 m)   SpO2 98%   BMI 29.63 kg/m  Well developed and well nourished in no acute distress HENT normal E scleral and icterus clear Neck Supple JVP <10; carotids brisk and full Irregularly irRegular rate and rhythm, no murmurs gallops or rub Soft with active bowel sounds No clubbing cyanosis Trace Edema Alert and oriented, grossly normal motor and sensory function Skin Warm and Dry  ECG atrial fib 115 /-/08/32 sorry this morning  CrCl cannot be calculated (Patient's most recent lab result is older than the maximum 21 days  allowed.).   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  With her LV dysfunction, I have taken the liberty of discontinuing her metoprolol to tartrate and changing over to metoprolol succinate.  In addition, given her relatively low blood pressure and the need to control her heart rate, I have started her on digoxin 0.25 x x3 days and then 0.125 mg daily.  With her volume overload, we  also started her on furosemide 20 mg daily.  We will add 10 mg of potassium in the setting of normal renal function we will plan to reassess her dig level and her electrolytes next week when she comes back to see Dr. Harl Bowie.  At that juncture, perhaps her blood pressure allow the addition of further afterload reduction.  In terms of the strategy, not being able to hold sinus rhythm, a rhythm controlling strategy would require antiarrhythmic therapy, and I would not use dofetilide given her age, which would then need augmented AV nodal blockade and almost certainly backup bradycardia pacing.  Alternatively, a rate controlling strategy this 88 year old woman may suffice to control her rate allow for improvement of her LV function and reduction of her symptoms.  We have elected after discussion to pursue the latter And If This Is Insufficient, We Can pivot towards a rhythm controlling strategy.  Reminded her that Xarelto is long-term therapy  Current medicines are reviewed at length with the patient today .  The patient does not  have concerns regarding medicines.

## 2022-07-02 ENCOUNTER — Encounter (HOSPITAL_COMMUNITY): Payer: Self-pay

## 2022-07-02 ENCOUNTER — Telehealth: Payer: Self-pay

## 2022-07-02 ENCOUNTER — Inpatient Hospital Stay: Admit: 2022-07-02 | Payer: Medicare HMO | Admitting: Internal Medicine

## 2022-07-02 ENCOUNTER — Telehealth: Payer: Self-pay | Admitting: Internal Medicine

## 2022-07-02 DIAGNOSIS — M549 Dorsalgia, unspecified: Secondary | ICD-10-CM | POA: Diagnosis not present

## 2022-07-02 DIAGNOSIS — Z0000 Encounter for general adult medical examination without abnormal findings: Secondary | ICD-10-CM | POA: Diagnosis not present

## 2022-07-02 DIAGNOSIS — E663 Overweight: Secondary | ICD-10-CM | POA: Diagnosis not present

## 2022-07-02 DIAGNOSIS — K5900 Constipation, unspecified: Secondary | ICD-10-CM | POA: Diagnosis not present

## 2022-07-02 DIAGNOSIS — D6869 Other thrombophilia: Secondary | ICD-10-CM | POA: Diagnosis not present

## 2022-07-02 DIAGNOSIS — I700 Atherosclerosis of aorta: Secondary | ICD-10-CM | POA: Diagnosis not present

## 2022-07-02 DIAGNOSIS — R2681 Unsteadiness on feet: Secondary | ICD-10-CM | POA: Diagnosis not present

## 2022-07-02 DIAGNOSIS — N289 Disorder of kidney and ureter, unspecified: Secondary | ICD-10-CM | POA: Diagnosis not present

## 2022-07-02 DIAGNOSIS — I480 Paroxysmal atrial fibrillation: Secondary | ICD-10-CM | POA: Diagnosis not present

## 2022-07-02 LAB — ECHOCARDIOGRAM COMPLETE
Area-P 1/2: 3.78 cm2
MV M vel: 3.59 m/s
MV Peak grad: 51.6 mmHg
P 1/2 time: 182 msec
Radius: 0.65 cm
S' Lateral: 3.9 cm

## 2022-07-02 NOTE — Telephone Encounter (Signed)
Called Angela Leach about her echocardiogram and systolic heart failure. She continues to have DOE and orthopnea. She states she can't afford digoxin and did not start it. I recommended we direct admit her to the hospital and start amiodarone. I suspect her cardiomyopathy is rate related. I had concerns about tachy-brad syndrome, if amiodarone is not successful, can consider PPM/AVN ablation with EP. She saw Dr. Caryl Comes in the office yesterday and we discussed this plan.

## 2022-07-02 NOTE — Telephone Encounter (Signed)
Called and spoke with patient placement who states that they will work on getting patient a cardiac bed. Per patient placement they will reach out to patient when bed is ready for admission. Will route to MD to make aware.    Janina Mayo, MD  07/02/2022  1:02 PM EDT Back to Top    Hi Eliezer Lofts, called Mrs. Lemberger about her echocardiogram and systolic heart failure. She continues to have DOE and orthopnea. She states she can't afford digoxin and did not start it. I recommended we direct admit her to the hospital and start amiodarone. I suspect her cardiomyopathy is rate related. I had concerns about tachy-brad syndrome, if amiodarone is not successful, can consider PPM/AVN ablation.

## 2022-07-03 ENCOUNTER — Telehealth: Payer: Self-pay | Admitting: Internal Medicine

## 2022-07-03 NOTE — Telephone Encounter (Signed)
When call was transferred- there was no one on the call. Advised if they called back to let me know.

## 2022-07-03 NOTE — Telephone Encounter (Signed)
Hospital is calling to follow up on conversation of Dr. Harl Bowie and nurse yesterday

## 2022-07-06 NOTE — Telephone Encounter (Signed)
Saw that admission was cancelled. Called patient to check on her and follow up but unable to reach. Left message to call back to office.

## 2022-07-07 NOTE — Telephone Encounter (Signed)
Attempted to call patient/patients daughter on number listed in chart. Unable to reach. Left message to call back to office. Just checking in to see how patient is doing since admission was cancelled.

## 2022-07-08 NOTE — Telephone Encounter (Signed)
Attempted to call patient to follow up- unable to reach. Did see where patient cancelled appointment originally scheduled for tomorrow with Dr. Harl Bowie- per that appointment note patient cancelled due to seeing another provider. Will make Dr. Harl Bowie aware. No other appointments noted for cardiology. Left message for patient to call back.

## 2022-07-09 ENCOUNTER — Ambulatory Visit: Payer: Medicare HMO | Admitting: Internal Medicine

## 2022-07-09 DIAGNOSIS — I480 Paroxysmal atrial fibrillation: Secondary | ICD-10-CM | POA: Diagnosis not present

## 2022-07-09 DIAGNOSIS — R2681 Unsteadiness on feet: Secondary | ICD-10-CM | POA: Diagnosis not present

## 2022-07-09 DIAGNOSIS — Z79899 Other long term (current) drug therapy: Secondary | ICD-10-CM | POA: Diagnosis not present

## 2022-07-09 DIAGNOSIS — I5020 Unspecified systolic (congestive) heart failure: Secondary | ICD-10-CM | POA: Diagnosis not present

## 2022-07-09 DIAGNOSIS — Z0001 Encounter for general adult medical examination with abnormal findings: Secondary | ICD-10-CM | POA: Diagnosis not present

## 2022-07-09 DIAGNOSIS — Z86718 Personal history of other venous thrombosis and embolism: Secondary | ICD-10-CM | POA: Diagnosis not present

## 2022-07-09 DIAGNOSIS — E559 Vitamin D deficiency, unspecified: Secondary | ICD-10-CM | POA: Diagnosis not present

## 2022-07-09 DIAGNOSIS — Z6825 Body mass index (BMI) 25.0-25.9, adult: Secondary | ICD-10-CM | POA: Diagnosis not present

## 2022-07-09 DIAGNOSIS — E663 Overweight: Secondary | ICD-10-CM | POA: Diagnosis not present

## 2022-07-23 DIAGNOSIS — R109 Unspecified abdominal pain: Secondary | ICD-10-CM | POA: Diagnosis not present

## 2022-07-23 DIAGNOSIS — R2681 Unsteadiness on feet: Secondary | ICD-10-CM | POA: Diagnosis not present

## 2022-07-23 DIAGNOSIS — Z6825 Body mass index (BMI) 25.0-25.9, adult: Secondary | ICD-10-CM | POA: Diagnosis not present

## 2022-07-23 DIAGNOSIS — E663 Overweight: Secondary | ICD-10-CM | POA: Diagnosis not present

## 2022-07-30 ENCOUNTER — Other Ambulatory Visit: Payer: Self-pay | Admitting: Family Medicine

## 2022-07-30 DIAGNOSIS — R109 Unspecified abdominal pain: Secondary | ICD-10-CM

## 2022-08-07 ENCOUNTER — Ambulatory Visit: Payer: Medicare HMO | Admitting: Nurse Practitioner

## 2022-09-07 ENCOUNTER — Other Ambulatory Visit: Payer: Medicare HMO

## 2022-11-16 ENCOUNTER — Telehealth: Payer: Self-pay | Admitting: Nurse Practitioner

## 2022-11-16 NOTE — Telephone Encounter (Signed)
Called patient to schedule Medicare Annual Wellness Visit (AWV). Left message for patient to call back and schedule Medicare Annual Wellness Visit (AWV).  Last date of AWV:  due awvi 09/28/09 per [facility]   Please schedule an appointment at any time with [facility]   If any questions, please contact me at [phone]  Thank you ,  Barkley Boards AWV direct phone # [phone]

## 2022-11-19 NOTE — Telephone Encounter (Signed)
Contacted Angela Leach to schedule their annual wellness visit. Patient declined to schedule AWV at this time.  Angela Leach AWV direct phone # [phone]  Patients daughter returned my call she stated patient changed PCP  [facility] is [facility] now
